# Patient Record
Sex: Female | Born: 1965 | Race: White | Hispanic: No | Marital: Married | Smoking: Former smoker
Health system: Southern US, Community
[De-identification: ages and names within clinical notes are randomized; demographics above are authoritative.]

---

## 2007-11-23 ENCOUNTER — Encounter (INDEPENDENT_AMBULATORY_CARE_PROVIDER_SITE_OTHER): Payer: Self-pay | Admitting: Obstetrics and Gynecology

## 2007-11-23 ENCOUNTER — Ambulatory Visit (HOSPITAL_COMMUNITY): Admission: RE | Admit: 2007-11-23 | Discharge: 2007-11-24 | Payer: Self-pay | Admitting: Obstetrics and Gynecology

## 2008-06-04 ENCOUNTER — Encounter: Admission: RE | Admit: 2008-06-04 | Discharge: 2008-06-04 | Payer: Self-pay | Admitting: Obstetrics and Gynecology

## 2010-09-23 NOTE — Op Note (Signed)
NAMEDELMY, HOLDREN                ACCOUNT NO.:  [id number]   MEDICAL RECORD NO.:  [id number]          PATIENT TYPE:  OIB   LOCATION:  9312                          FACILITY:  [facility]   PHYSICIAN:  Duke Salvia. Marcelle Overlie, M.D.DATE OF BIRTH:  11/08/1965   DATE OF PROCEDURE:  11/23/2007  DATE OF DISCHARGE:                               OPERATIVE REPORT   PREOPERATIVE DIAGNOSIS:  Recurrent cervical dysplasia.   POSTOPERATIVE DIAGNOSIS:  Recurrent cervical dysplasia.   PROCEDURE:  Laparoscopic-assisted vaginal hysterectomy.   SURGEON:  Duke Salvia. Marcelle Overlie, MD   ASSISTANT:  Freddy Finner, MD   ANESTHESIA:  General endotracheal.   COMPLICATIONS:  None.   DRAINS:  Foley catheter.   BLOOD LOSS:  250.   PROCEDURE AND FINDINGS:  The patient was taken to the operating room.  After an adequate level of general anesthesia was obtained with the  patient legs in stirrups, the abdomen, perineum and vagina were prepped  and draped in the usual manner for LAVH.  Her bladder was drained, EUA  carried out, uterus was normal size, mobile, adnexa negative.  Hulka  tenaculum was positioned.  Attention directed to the subumbilical area  which was infiltrated with 0.25% Marcaine plain.  A small incision was  made and the Veress needle was introduced without difficulty.  Its intra-  abdominal position was verified by pressure and water testing.  After 3  liters, pneumoperitoneum was then created.  Laparoscopic trocar and  sleeve were then introduced without difficulty.  There was no evidence  of any bleeding or trauma.  Three fingerbreadths above the symphysis in  midline, a 5-mm trocar was inserted under direct visualization.   The patient placed in Trendelenburg and pelvic findings were  unremarkable.  Using the EndoSeal  instrument starting at the right, the  utero-ovarian pedicle was coagulated and divided down to and including  the round ligament with excellent hemostasis.  Exact same repeated on  the opposite side.  Both ovaries were thus conserved.  The vaginal  portion of the procedure started next.   Legs were extended.  Weighted speculum was positioned.  The cervical  vaginal mucosa was incised with a Bovie.  Posterior colpotomy performed  without difficulty.  The bladder was advanced superiorly with sharp and  blunt dissection until the peritoneal reflection could be identified,  peritoneum entered sharply, retractor positioned used to elevate the  bladder gently out of the field.   Using the handheld Gyrus instrument, the uterosacral ligament, cardinal  ligament and uterine vasculature pedicles were then coagulated and  divided.  The fundus of the uterus was then delivered posteriorly.  Remaining pedicles were grasped with a clamp, the specimen was excised  and the free pedicles were tied with free tie of 0 Vicryl.  The cuff was  then closed from 3 to 9 o'clock with a running locked 2-0 Vicryl suture.  This was hemostatic prior to closure.  Sponge, needle and the instrument  counts were reported as correct x2.  McCall culdoplasty suture was then  positioned picking up the left uterosacral ligament, posterior  peritoneum across to  the right uterosacral ligament which was tied down  for extra posterior support.  The cuff was then closed right-to-left  with interrupted 2-0 Monocryl sutures.  Foley catheter positioned  draining clear urine.  Repeat laparoscopy carried out at that point.  Nezhat irrigator was used to irrigate thoroughly and several small areas  of oozing were coagulated with the EndoSeal.  Repeat irrigation and  inspection at reduced pressure revealed excellent hemostasis.  Instruments were removed, gas allowed to escape.  Defects closed with 4-  0 Dexon subcuticular sutures and Dermabond.  She tolerated this well,  went to recovery room in good condition.      Richard M. Marcelle Overlie, M.D.  Electronically Signed     RMH/MEDQ  D:  11/23/2007  T:  11/23/2007   Job:  [id number]

## 2010-09-23 NOTE — Discharge Summary (Signed)
NAMEJANUS, Monique Richardson                ACCOUNT NO.:  [id number]   MEDICAL RECORD NO.:  [id number]          PATIENT TYPE:  OIB   LOCATION:  9312                          [facility]ILITY[facility]  [facility]   PHYSICIAN:  Duke Salvia. Marcelle Overlie, M.D.DATE OF BIRTH:  06-29-1965   DATE OF ADMISSION:  11/23/2007  DATE OF DISCHARGE:  11/24/2007                               DISCHARGE SUMMARY   DISCHARGE DIAGNOSES:  1. Recurrent cervical dysplasia.  2. Laparoscopic-assisted vaginal hysterectomy this admission.   SUMMARY OF HISTORY AND PHYSICAL EXAM:  Please see admission H&P for  details.  Briefly, a 45 year old female who had a prior CKC with  recurrent dysplasia, who presents for hysterectomy.   HOSPITAL COURSE:  On November 23, 2007, under general anesthesia, the  patient underwent LAVH.  Both ovaries were normal and were conserved.  On the first postoperative day, she was tolerating a regular diet and  voiding without difficulty.  Her abdominal exam was unremarkable.  She  was afebrile.  Preoperative hemoglobin was 13.9, postoperative was 11.1.   OTHER LABORATORY DATA:  Blood type was O+.  UPT was negative.   DISPOSITION:  The patient was discharged on Tylox p.r.n. pain or Motrin  800 mg p.o. nightly q.12 hours p.r.n. pain.  Will return to the office  in 1 week.  Advised to report any increased vaginal bleeding, incisional  redness or drainage, persistent nausea, vomiting, or urinary complaints.  She was given some specific instructions regarding diet and exercise.   CONDITION:  Good.   ACTIVITY:  Graded increased.      Richard M. Marcelle Overlie, M.D.  Electronically Signed     RMH/MEDQ  D:  11/24/2007  T:  11/25/2007  Job:  [id number]

## 2010-09-23 NOTE — H&P (Signed)
Monique Richardson, Monique Richardson                ACCOUNT NO.:  [id number]   MEDICAL RECORD NO.:  [id number]          PATIENT TYPE:  AMB   LOCATION:  SDC                           [facility]   PHYSICIAN:  Duke Salvia. Marcelle Overlie, M.D.DATE OF BIRTH:  12-31-1965   DATE OF ADMISSION:  DATE OF DISCHARGE:                              HISTORY & PHYSICAL   CHIEF COMPLAINT:  Recurrent cervical dysplasia.   HISTORY OF PRESENT ILLNESS:  A 45 year old G3, P3, whose husband has had  a vasectomy.  I saw her originally in September 2008.  Her history is  significant for having had a prior cone biopsy in [place] in 2007.  Her followup Pap smear at that time has been either normal or atypical  cells since that time.  Pap smear done in October 2008 showed mild  dysplasia.  Colposcopy was satisfactory.  There was one area of  acetowhite that was completely biopsied off that returned mild  dysplasia.  Followup Pap in April 2009 returned high-grade dysplasia.  Repeat colposcopy and biopsy was performed in May 2009, that showed no  dysplasia.  The ECC negative, benign proliferative endometrium.  She  subsequently underwent two past LEEP after decision was made to schedule  hysterectomy.  A pathology from the LEEP specimen showed benign squamous  mucosa.  Because of persistence of the higher-grade dysplasia, she has  elected for the option of LAVH.   The procedure including risks related to bleeding, infection, adjacent  organ injury, the possible need for open or additional surgery were all  reviewed with her, which she understands and accepts.   PAST MEDICAL HISTORY:   ALLERGIES:  None.   OPERATIONS:  Cone biopsy.   OBSTETRICAL HISTORY:  One TAB and three vaginal deliveries.   CURRENT MEDICATIONS:  None.   FAMILY HISTORY:  Otherwise unremarkable.   PHYSICAL EXAMINATION:  VITAL SIGNS:  Temperature 98.2 and blood pressure  120/78.  HEENT:  Unremarkable.  NECK:  Supple without masses.  LUNGS:  Clear.  CARDIOVASCULAR:  Regular rate and rhythm without murmurs, rubs, or  gallops.  BREASTS:  Without masses.  ABDOMEN:  Soft and nontender.  PELVIC:  Normal external genitalia.  Vaginal and cervix clear.  Uterus  in mid position, normal size.  Adnexa negative.  NEURO:  Unremarkable.   IMPRESSION:  Recurrent cervical dysplasia history as noted above.   PLAN:  LAVH, procedure and risks reviewed as above.      Richard M. Marcelle Overlie, M.D.  Electronically Signed     RMH/MEDQ  D:  11/18/2007  T:  11/18/2007  Job:  [id number]

## 2011-02-05 LAB — CBC
HCT: 40.5
Hemoglobin: 13.9
MCHC: 34.4
MCV: 86.2
Platelets: 169
RBC: 4.71
RDW: 11.8
WBC: 5.1

## 2011-02-05 LAB — TYPE AND SCREEN
ABO/RH(D): O POS
Antibody Screen: NEGATIVE

## 2011-02-05 LAB — ABO/RH: ABO/RH(D): O POS

## 2011-02-06 LAB — CBC
HCT: 32.2 — ABNORMAL LOW
MCV: 87.1
RBC: 3.69 — ABNORMAL LOW
WBC: 6

## 2013-09-27 ENCOUNTER — Other Ambulatory Visit: Payer: Self-pay | Admitting: Obstetrics and Gynecology

## 2013-09-27 DIAGNOSIS — R928 Other abnormal and inconclusive findings on diagnostic imaging of breast: Secondary | ICD-10-CM

## 2013-10-09 ENCOUNTER — Ambulatory Visit
Admission: RE | Admit: 2013-10-09 | Discharge: 2013-10-09 | Disposition: A | Discharge status: Home / Self Care | Payer: BC Managed Care – PPO | Source: Ambulatory Visit | Attending: Obstetrics and Gynecology | Admitting: Obstetrics and Gynecology

## 2013-10-09 ENCOUNTER — Encounter (INDEPENDENT_AMBULATORY_CARE_PROVIDER_SITE_OTHER): Payer: Self-pay

## 2013-10-09 DIAGNOSIS — R928 Other abnormal and inconclusive findings on diagnostic imaging of breast: Secondary | ICD-10-CM

## 2013-10-13 ENCOUNTER — Ambulatory Visit
Admission: RE | Admit: 2013-10-13 | Discharge: 2013-10-13 | Disposition: A | Discharge status: Home / Self Care | Payer: BC Managed Care – PPO | Source: Ambulatory Visit | Attending: Chiropractic Medicine | Admitting: Chiropractic Medicine

## 2013-10-13 ENCOUNTER — Other Ambulatory Visit: Payer: Self-pay | Admitting: Chiropractic Medicine

## 2013-10-13 DIAGNOSIS — R29898 Other symptoms and signs involving the musculoskeletal system: Secondary | ICD-10-CM

## 2013-10-13 DIAGNOSIS — M542 Cervicalgia: Secondary | ICD-10-CM

## 2013-10-13 DIAGNOSIS — F489 Nonpsychotic mental disorder, unspecified: Secondary | ICD-10-CM

## 2014-03-20 ENCOUNTER — Other Ambulatory Visit: Payer: Self-pay | Admitting: Obstetrics and Gynecology

## 2014-03-21 LAB — CYTOLOGY - PAP

## 2014-04-10 ENCOUNTER — Other Ambulatory Visit: Payer: Self-pay | Admitting: Obstetrics and Gynecology

## 2014-06-15 ENCOUNTER — Other Ambulatory Visit (HOSPITAL_BASED_OUTPATIENT_CLINIC_OR_DEPARTMENT_OTHER): Payer: BLUE CROSS/BLUE SHIELD | Admitting: Lab

## 2014-06-15 ENCOUNTER — Ambulatory Visit: Payer: BLUE CROSS/BLUE SHIELD

## 2014-06-15 ENCOUNTER — Ambulatory Visit (HOSPITAL_BASED_OUTPATIENT_CLINIC_OR_DEPARTMENT_OTHER): Payer: BLUE CROSS/BLUE SHIELD | Admitting: Hematology & Oncology

## 2014-06-15 VITALS — BP 119/78 | HR 89 | Temp 98.0°F | Resp 18 | Wt 173.0 lb

## 2014-06-15 DIAGNOSIS — D709 Neutropenia, unspecified: Secondary | ICD-10-CM

## 2014-06-15 DIAGNOSIS — D72819 Decreased white blood cell count, unspecified: Secondary | ICD-10-CM

## 2014-06-15 LAB — CBC WITH DIFFERENTIAL (CANCER CENTER ONLY)
BASO#: 0 10*3/uL (ref 0.0–0.2)
BASO%: 0.3 % (ref 0.0–2.0)
EOS ABS: 0 10*3/uL (ref 0.0–0.5)
EOS%: 1.3 % (ref 0.0–7.0)
HEMATOCRIT: 37.3 % (ref 34.8–46.6)
HGB: 12.6 g/dL (ref 11.6–15.9)
LYMPH#: 2.2 10*3/uL (ref 0.9–3.3)
LYMPH%: 71.1 % — AB (ref 14.0–48.0)
MCH: 28.2 pg (ref 26.0–34.0)
MCHC: 33.8 g/dL (ref 32.0–36.0)
MCV: 83 fL (ref 81–101)
MONO#: 0.2 10*3/uL (ref 0.1–0.9)
MONO%: 6.3 % (ref 0.0–13.0)
NEUT%: 21 % — ABNORMAL LOW (ref 39.6–80.0)
NEUTROS ABS: 0.7 10*3/uL — AB (ref 1.5–6.5)
Platelets: 145 10*3/uL (ref 145–400)
RBC: 4.47 10*6/uL (ref 3.70–5.32)
RDW: 12.9 % (ref 11.1–15.7)
WBC: 3.2 10*3/uL — AB (ref 3.9–10.0)

## 2014-06-15 LAB — CHCC SATELLITE - SMEAR

## 2014-06-15 NOTE — Progress Notes (Signed)
Referral MD  Reason for Referral: Neutropenia   Chief Complaint  Patient presents with  . Follow-up  : I'm here because I was told that I was at risk for infection  HPI: Monique Richardson is a very charming 50 year old white female. She really has no past history. She has had a harsh old hysterectomy several years ago. This is for a cervical issue.  She's not been to a doctor for about 3 or 4 years.  Because of work, she had a get a physical done. She went to Dr. Tollie Pizza. He probably had some lab work on her. Suppository  , She was found to have a white cell count of 2.6. Hemoglobin 12.6. Platelet count was 164K. Her white cell differential showed 9% segs, 75% lymphs, 10 monos. She had some atypical lymphocytes. She had a normal electrolyte panel. Her TSH was okay.  His office called our office and wanted her to be seen immediately.  She has felt well. She says she burned her tongue about 3 days ago.  She has been working without difficulty. She works for [employer]. 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  She's had no weight loss or weight gain. She's had no fever. She's had no infections. She's had no change in bowel or bladder habits. She has been getting her mammograms.  She's had no rashes. She is not a vegetarian. Her appetite has been doing pretty well.  She does not use any kind of supplements.  She does not smoke. She has social alcoholl use.  There is no obvious risk factors for HIV or hepatitis.  She has very fair skin. She has had some moles removed.  She was kindly referred to our office.   No past medical history on file.:  No past surgical history on file.:  No current outpatient prescriptions on file.:  :  No Known Allergies:  No family history on file.:  History   Social History  . Marital Status: Married    Spouse Name: N/A    Number of Children: N/A  . Years of Education: N/A   Occupational History  . Not on  file.   Social History Main Topics  . Smoking status: Not on file  . Smokeless tobacco: Not on file  . Alcohol Use: Not on file  . Drug Use: Not on file  . Sexual Activity: Not on file   Other Topics Concern  . Not on file   Social History Narrative  . No narrative on file  :  Pertinent items are noted in HPI.  Exam: @IPVITALS @  well-developed and well-nourished white female in no obvious distress. Vital signs show temperature of 98. Pulse 89. Blood pressure 119/78. Weight is 173 pounds. Head and neck exam shows no ocular or oral lesions. There are no palpable cervical or supraclavicular lymph nodes. Lungs are clear bilaterally. Cardiac exam regular rate and rhythm with no murmurs, rubs or bruits. Abdomen is soft. She has good bowel sounds. There is no fluid wave. There is no palpable liver or spleen tip. Back exam shows no tenderness over the spine, ribs or hips. Extremities shows no clubbing, cyanosis or edema. She has good range of motion of her joints. There is no joint swelling, erythema or warmth. Skin exam shows no rashes, ecchymoses or petechia. Neurological exam is nonfocal.    Recent Labs  06/15/14 1404  WBC 3.2*  HGB 12.6  HCT 37.3  PLT 145  No results for input(s): NA, K, CL, CO2, GLUCOSE, BUN, CREATININE, CALCIUM in the last 72 hours.  Blood smear review: Normochromic and normocytic population of red blood cells. There are no nucleated red blood cells. There are no teardrop cells. She has no schistocytes or spherocytes. White cells appear normal in morphology and maturation. She has decreased neutrophils. She has no hypersegmented polys. She has an increase in lymphocytes. She has several large atypical lymphocytes. There are a few lymphocytes with large granules. I see no blasts. Platelets are adequate in number and size.  Pathology: None     Assessment and Plan: Monique Richardson is a very nice 49 year old white female. She has leukopenia and neutropenia. She had much  worse neutropenia yesterday. This seems to be improving.  I'm not sure as to why she would have these significant neutropenia. I'll see anything on her exam that would suggest any conic collagen vascular disease. I'll see anything that would suggest a myelodysplastic process.  It is really hard to know how quickly this will improve her how much she will improve. It is possible that she may have some degree of leukopenia and neutropenia.  For now, I do not see that we have to do a bone marrow test on her.  She is asymptomatic. I think because of this, we can just watch her and plan to get her back in about 6 weeks. I would like to think that in 6 weeks, her blood count will have normalized.  I told her that she pretty much do what she wants right now. She does not have to confine herself to home. She'll go out. She can exercise. She can work. She can eat what she would like.  I spent about an hour with she and her husband. They're both very very nice. She is originally from [facility].

## 2014-06-20 ENCOUNTER — Telehealth: Payer: Self-pay | Admitting: Hematology & Oncology

## 2014-06-20 NOTE — Telephone Encounter (Signed)
Faxed referral to Dr. Antonieta Pert office.

## 2014-07-25 ENCOUNTER — Ambulatory Visit (HOSPITAL_BASED_OUTPATIENT_CLINIC_OR_DEPARTMENT_OTHER): Payer: BLUE CROSS/BLUE SHIELD | Admitting: Hematology & Oncology

## 2014-07-25 ENCOUNTER — Encounter: Payer: Self-pay | Admitting: Hematology & Oncology

## 2014-07-25 ENCOUNTER — Other Ambulatory Visit (HOSPITAL_BASED_OUTPATIENT_CLINIC_OR_DEPARTMENT_OTHER): Payer: BLUE CROSS/BLUE SHIELD | Admitting: Lab

## 2014-07-25 VITALS — BP 120/82 | HR 77 | Temp 99.0°F | Resp 14 | Wt 174.0 lb

## 2014-07-25 DIAGNOSIS — G8929 Other chronic pain: Secondary | ICD-10-CM | POA: Insufficient documentation

## 2014-07-25 DIAGNOSIS — D729 Disorder of white blood cells, unspecified: Secondary | ICD-10-CM | POA: Insufficient documentation

## 2014-07-25 DIAGNOSIS — M545 Low back pain: Secondary | ICD-10-CM

## 2014-07-25 DIAGNOSIS — D72819 Decreased white blood cell count, unspecified: Secondary | ICD-10-CM

## 2014-07-25 DIAGNOSIS — R799 Abnormal finding of blood chemistry, unspecified: Secondary | ICD-10-CM | POA: Insufficient documentation

## 2014-07-25 LAB — CBC WITH DIFFERENTIAL (CANCER CENTER ONLY)
BASO#: 0 10*3/uL (ref 0.0–0.2)
BASO%: 0.3 % (ref 0.0–2.0)
EOS%: 1.4 % (ref 0.0–7.0)
Eosinophils Absolute: 0.1 10*3/uL (ref 0.0–0.5)
HEMATOCRIT: 35.5 % (ref 34.8–46.6)
HEMOGLOBIN: 12.2 g/dL (ref 11.6–15.9)
LYMPH#: 2.6 10*3/uL (ref 0.9–3.3)
LYMPH%: 71.4 % — AB (ref 14.0–48.0)
MCH: 28.8 pg (ref 26.0–34.0)
MCHC: 34.4 g/dL (ref 32.0–36.0)
MCV: 84 fL (ref 81–101)
MONO#: 0.3 10*3/uL (ref 0.1–0.9)
MONO%: 6.9 % (ref 0.0–13.0)
NEUT#: 0.7 10*3/uL — ABNORMAL LOW (ref 1.5–6.5)
NEUT%: 20 % — AB (ref 39.6–80.0)
Platelets: 107 10*3/uL — ABNORMAL LOW (ref 145–400)
RBC: 4.24 10*6/uL (ref 3.70–5.32)
RDW: 12.9 % (ref 11.1–15.7)
WBC: 3.6 10*3/uL — AB (ref 3.9–10.0)

## 2014-07-25 LAB — CHCC SATELLITE - SMEAR

## 2014-07-25 NOTE — Progress Notes (Signed)
  Hematology and Oncology Follow Up Visit  Monique Richardson [id number] 1965/11/08 48 y.o. 07/25/2014   Principle Diagnosis:  Chronic leukopenia   Current Therapy:    Observation     Interim History:  Monique Richardson is back for her second office visit. When we first saw her, she had an unremarkable blood smear and exam.  She's been doing well. She has no specific complaints. She and her husband are going to [place] in April.  She's had no cough. She's had no rashes. She's had no weight loss. She is worried about weight gain. There's been no change in bowel or bladder habits.  She is due for a mammogram soon.  She's had no headache. She's had no swallowing problems.  Medications:  Current outpatient prescriptions:  .  celecoxib (CELEBREX) 200 MG capsule, , Disp: , Rfl: 1  Allergies:  Allergies  Allergen Reactions  . No Known Allergies     Past Medical History, Surgical history, Social history, and Family History were reviewed and updated.  Review of Systems: As above  Physical Exam:  weight is 174 lb (78.926 kg). Her oral temperature is 99 F (37.2 C). Her blood pressure is 120/82 and her pulse is 77. Her respiration is 14.   Wt Readings from Last 3 Encounters:  07/25/14 174 lb (78.926 kg)  06/15/14 173 lb (78.472 kg)     Well-developed and well-nourished white female in no obvious distress. Head and neck exam shows no adenopathy in the neck. Thyroid is not palpable. Lungs are clear bilaterally. Cardiac exam regular rate and rhythm with a normal S1 and S2 with no murmurs, rubs or bruits. Abdomen is soft. She has good bowel sounds. There is no fluid wave. There is no palpable liver or spleen tip. Back exam shows no tenderness over the spine, ribs or hips. Extremities shows no clubbing, cyanosis or edema. Skin exam shows no rashes, ecchymoses or petechia. Neurological exam is nonfocal.  He has    Chemistry   No results found for: NA, K, CL, CO2, BUN, CREATININE, GLU No  results found for: CALCIUM, ALKPHOS, AST, ALT, BILITOT       Impression and Plan: Monique Richardson is 49 year old with leukopenia. Her white cell count is slightly better. I looked at her blood on the microscope. She had good maturation of her white blood cells. She had an increase in lymphocytes which appeared mature. I saw no blasts.  Her platelets are lower. On the blood smear, the platelets may have been clumping a little bit. However, they appear to be mature with good granulation.  For now, I still do not see that we had to do a bone marrow test on her.  I want to see her back in 4 months. I think this will be a good time frame in order to make decisions regarding anything invasive.  I spent about 25 minutes with her. I went over her lab work. I gave her some recommendations when she goes to [place]. I told her to drink a lot of water on the plane but did not drink anything but bottled water [place]. I told her to take baby aspirin a week before her trip. I told her to bring some Pepto-Bismol with her.     Volanda Napoleon, MD 3/16/20165:10 PM

## 2014-11-01 ENCOUNTER — Other Ambulatory Visit: Payer: Self-pay | Admitting: Obstetrics and Gynecology

## 2014-11-05 LAB — CYTOLOGY - PAP

## 2014-11-21 ENCOUNTER — Ambulatory Visit (HOSPITAL_BASED_OUTPATIENT_CLINIC_OR_DEPARTMENT_OTHER): Payer: BLUE CROSS/BLUE SHIELD | Admitting: Hematology & Oncology

## 2014-11-21 ENCOUNTER — Encounter: Payer: Self-pay | Admitting: Hematology & Oncology

## 2014-11-21 ENCOUNTER — Telehealth: Payer: Self-pay | Admitting: *Deleted

## 2014-11-21 ENCOUNTER — Other Ambulatory Visit (HOSPITAL_BASED_OUTPATIENT_CLINIC_OR_DEPARTMENT_OTHER): Payer: BLUE CROSS/BLUE SHIELD

## 2014-11-21 VITALS — BP 107/64 | HR 72 | Temp 98.0°F | Resp 14 | Ht 68.0 in | Wt 174.0 lb

## 2014-11-21 DIAGNOSIS — D72819 Decreased white blood cell count, unspecified: Secondary | ICD-10-CM

## 2014-11-21 DIAGNOSIS — D729 Disorder of white blood cells, unspecified: Secondary | ICD-10-CM

## 2014-11-21 LAB — CBC WITH DIFFERENTIAL (CANCER CENTER ONLY)
BASO#: 0 10*3/uL (ref 0.0–0.2)
BASO%: 0.6 % (ref 0.0–2.0)
EOS%: 2.2 % (ref 0.0–7.0)
Eosinophils Absolute: 0.1 10*3/uL (ref 0.0–0.5)
HEMATOCRIT: 36.5 % (ref 34.8–46.6)
HEMOGLOBIN: 12.4 g/dL (ref 11.6–15.9)
LYMPH#: 2.3 10*3/uL (ref 0.9–3.3)
LYMPH%: 73.2 % — ABNORMAL HIGH (ref 14.0–48.0)
MCH: 28.8 pg (ref 26.0–34.0)
MCHC: 34 g/dL (ref 32.0–36.0)
MCV: 85 fL (ref 81–101)
MONO#: 0.3 10*3/uL (ref 0.1–0.9)
MONO%: 9.3 % (ref 0.0–13.0)
NEUT%: 14.7 % — ABNORMAL LOW (ref 39.6–80.0)
NEUTROS ABS: 0.5 10*3/uL — AB (ref 1.5–6.5)
Platelets: 122 10*3/uL — ABNORMAL LOW (ref 145–400)
RBC: 4.31 10*6/uL (ref 3.70–5.32)
RDW: 13.1 % (ref 11.1–15.7)
WBC: 3.1 10*3/uL — AB (ref 3.9–10.0)

## 2014-11-21 LAB — CHCC SATELLITE - SMEAR

## 2014-11-21 LAB — TECHNOLOGIST REVIEW CHCC SATELLITE

## 2014-11-21 NOTE — Telephone Encounter (Signed)
Critical Value Neutrophils 0.5 Dr Ennever aware. No orders at this time 

## 2014-11-21 NOTE — Progress Notes (Signed)
  Hematology and Oncology Follow Up Visit  Monique Richardson [id number] [birth year] 49 y.o. 11/21/2014   Principle Diagnosis:  Chronic leukopenia   Current Therapy:    Observation     Interim History:  Monique Richardson is back for follow-up. She is doing well. She had a good time in [place]. She showed me a lot of pictures. She has had no problems with infections over the. She denied drink the water.  This summer, she's had no problems. She's had no fever. She had no rashes. There's been no bleeding. She's had no change in bowel or bladder habits. She's had no cough. She has had no sores in Monique mouth.  She will be a grandmother soon. Monique Richardson and his girlfriend will be having a baby.  Medications:  Current outpatient prescriptions:  Marland Kitchen  UNABLE TO FIND, 11-21-14 NOT TAKING ANY MEDICATIONS, Disp: , Rfl:   Allergies:  Allergies  Allergen Reactions  . No Known Allergies     Past Medical History, Surgical history, Social history, and Family History were reviewed and updated.  Review of Systems: As above  Physical Exam:  height is 5\' 8"  (1.727 m) and weight is 174 lb (78.926 kg). Monique oral temperature is 98 F (36.7 C). Monique blood pressure is 107/64 and Monique pulse is 72. Monique respiration is 14.   Wt Readings from Last 3 Encounters:  11/21/14 174 lb (78.926 kg)  07/25/14 174 lb (78.926 kg)  06/15/14 173 lb (78.472 kg)     Well-developed and well-nourished white female in no obvious distress. Head and neck exam shows no adenopathy in the neck. Thyroid is not palpable. Lungs are clear bilaterally. Cardiac exam regular rate and rhythm with a normal S1 and S2 with no murmurs, rubs or bruits. Abdomen is soft. She has good bowel sounds. There is no fluid wave. There is no palpable liver or spleen tip. Back exam shows no tenderness over the spine, ribs or hips. Extremities shows no clubbing, cyanosis or edema. Skin exam shows no rashes, ecchymoses or petechia. Neurological exam is nonfocal.  He  has    Chemistry   No results found for: NA, K, CL, CO2, BUN, CREATININE, GLU No results found for: CALCIUM, ALKPHOS, AST, ALT, BILITOT       Impression and Plan: Monique Richardson is 49 year old with leukopenia. Monique white cell count is slightly lower. I looked at Monique blood on the microscope. She had good maturation of Monique white blood cells. She had an increase in lymphocytes which appeared mature. I saw no blasts.  Monique platelets are better. On the blood smear, the platelets may have been clumping a little bit. However, they appear to have mature with good granulation.  For now, I still do not see that we had to do a bone marrow test on Monique.  I want to see Monique back in 4 months. I think this will be a good time frame in order to make decisions regarding anything invasive.  I spent about 25 minutes with Monique. I went over Monique lab work. I made sure that she takes a lot of water this summer and that she uses a lot of sunscreen.    Volanda Napoleon, MD 7/13/201610:58 AM

## 2015-03-25 ENCOUNTER — Encounter: Payer: Self-pay | Admitting: Hematology & Oncology

## 2015-03-25 ENCOUNTER — Other Ambulatory Visit (HOSPITAL_BASED_OUTPATIENT_CLINIC_OR_DEPARTMENT_OTHER): Payer: BLUE CROSS/BLUE SHIELD

## 2015-03-25 ENCOUNTER — Ambulatory Visit (HOSPITAL_BASED_OUTPATIENT_CLINIC_OR_DEPARTMENT_OTHER): Payer: BLUE CROSS/BLUE SHIELD | Admitting: Hematology & Oncology

## 2015-03-25 ENCOUNTER — Telehealth: Payer: Self-pay | Admitting: *Deleted

## 2015-03-25 VITALS — BP 107/78 | HR 87 | Temp 98.0°F | Resp 16 | Ht 68.0 in | Wt 178.0 lb

## 2015-03-25 DIAGNOSIS — D72819 Decreased white blood cell count, unspecified: Secondary | ICD-10-CM

## 2015-03-25 DIAGNOSIS — D729 Disorder of white blood cells, unspecified: Secondary | ICD-10-CM

## 2015-03-25 LAB — CBC WITH DIFFERENTIAL (CANCER CENTER ONLY)
BASO#: 0 10*3/uL (ref 0.0–0.2)
BASO%: 0.5 % (ref 0.0–2.0)
EOS%: 2.1 % (ref 0.0–7.0)
Eosinophils Absolute: 0.1 10*3/uL (ref 0.0–0.5)
HCT: 39 % (ref 34.8–46.6)
HGB: 12.9 g/dL (ref 11.6–15.9)
LYMPH#: 3.3 10*3/uL (ref 0.9–3.3)
LYMPH%: 76.9 % — AB (ref 14.0–48.0)
MCH: 28.2 pg (ref 26.0–34.0)
MCHC: 33.1 g/dL (ref 32.0–36.0)
MCV: 85 fL (ref 81–101)
MONO#: 0.3 10*3/uL (ref 0.1–0.9)
MONO%: 7.9 % (ref 0.0–13.0)
NEUT#: 0.5 10*3/uL — CL (ref 1.5–6.5)
NEUT%: 12.6 % — AB (ref 39.6–80.0)
PLATELETS: 135 10*3/uL — AB (ref 145–400)
RBC: 4.58 10*6/uL (ref 3.70–5.32)
RDW: 12.9 % (ref 11.1–15.7)
WBC: 4.3 10*3/uL (ref 3.9–10.0)

## 2015-03-25 LAB — CHCC SATELLITE - SMEAR

## 2015-03-25 NOTE — Progress Notes (Signed)
  Hematology and Oncology Follow Up Visit  Monique Richardson [id number] 1965/12/26 48 y.o. 03/25/2015   Principle Diagnosis:  Chronic leukopenia   Current Therapy:    Observation     Interim History:  Monique Richardson is back for follow-up. She is now grandmother. Her son and his girlfriend had there a BE about 4 weeks ago. He was a healthy baby boy.  Otherwise, the news is that they're moving to [place]. They will be 2 hours away. Her husband will be transferred. However, she still wants to come and see her doctors in the area.  She's had no problems rashes. She's had no fever. She's had no infections. She's had no change in bowel or bladder habits.  She has her mammograms yearly. I did last mammogram she had was this summer.  She's had no change in her appetite. His been no change in her diet. Patient had no cough. She's had no swollen lymph nodes.  Overall, her performance status is ECOG 0. His Medications:  Current outpatient prescriptions:  Marland Kitchen  UNABLE TO FIND, 11-21-14 NOT TAKING ANY MEDICATIONS, Disp: , Rfl:   Allergies:  Allergies  Allergen Reactions  . No Known Allergies     Past Medical History, Surgical history, Social history, and Family History were reviewed and updated.  Review of Systems: As above  Physical Exam:  height is $RemoveB'5\' 8"'SHlCVclF$  (1.727 m) and weight is 178 lb (80.74 kg). Her oral temperature is 98 F (36.7 C). Her blood pressure is 107/78 and her pulse is 87. Her respiration is 16.   Wt Readings from Last 3 Encounters:  03/25/15 178 lb (80.74 kg)  11/21/14 174 lb (78.926 kg)  07/25/14 174 lb (78.926 kg)     Well-developed and well-nourished white female in no obvious distress. Head and neck exam shows no adenopathy in the neck. Thyroid is not palpable. Lungs are clear bilaterally. Cardiac exam regular rate and rhythm with a normal S1 and S2 with no murmurs, rubs or bruits. Abdomen is soft. She has good bowel sounds. There is no fluid wave. There is no palpable  liver or spleen tip. Back exam shows no tenderness over the spine, ribs or hips. Extremities shows no clubbing, cyanosis or edema. Skin exam shows no rashes, ecchymoses or petechia. Neurological exam is nonfocal.  He has    Chemistry   No results found for: NA, K, CL, CO2, BUN, CREATININE, GLU No results found for: CALCIUM, ALKPHOS, AST, ALT, BILITOT       Impression and Plan: Monique Richardson is 49 year old with leukopenia. Although her white cell count is a little bit higher, her absolute neutrophil count is still quite low.  I think that at some point, she will need to have a bone marrow biopsy done. Since she is asymptomatic, and since her blood smear has been relatively benign, I really am not sure that a bone marrow biopsy is going to help [place] out right now.  I will plan to see her back in another 4 months. I want to try to get her through the holidays and wintertime before she comes back.   Volanda Napoleon, MD 11/14/201610:28 AM

## 2015-03-25 NOTE — Telephone Encounter (Signed)
Critical Value Neutrophils 0.5 Dr Ennever aware. No orders at this time 

## 2015-07-22 ENCOUNTER — Encounter: Payer: Self-pay | Admitting: Hematology & Oncology

## 2015-07-22 ENCOUNTER — Other Ambulatory Visit (HOSPITAL_BASED_OUTPATIENT_CLINIC_OR_DEPARTMENT_OTHER): Payer: BLUE CROSS/BLUE SHIELD

## 2015-07-22 ENCOUNTER — Ambulatory Visit (HOSPITAL_BASED_OUTPATIENT_CLINIC_OR_DEPARTMENT_OTHER): Payer: BLUE CROSS/BLUE SHIELD | Admitting: Hematology & Oncology

## 2015-07-22 VITALS — BP 114/73 | HR 69 | Temp 98.5°F | Resp 16 | Ht 68.0 in | Wt 179.0 lb

## 2015-07-22 DIAGNOSIS — D729 Disorder of white blood cells, unspecified: Secondary | ICD-10-CM

## 2015-07-22 DIAGNOSIS — D72819 Decreased white blood cell count, unspecified: Secondary | ICD-10-CM

## 2015-07-22 DIAGNOSIS — R799 Abnormal finding of blood chemistry, unspecified: Secondary | ICD-10-CM

## 2015-07-22 LAB — CBC WITH DIFFERENTIAL (CANCER CENTER ONLY)
BASO#: 0 10*3/uL (ref 0.0–0.2)
BASO%: 0.4 % (ref 0.0–2.0)
EOS%: 1.5 % (ref 0.0–7.0)
Eosinophils Absolute: 0.1 10*3/uL (ref 0.0–0.5)
HCT: 36.8 % (ref 34.8–46.6)
HEMOGLOBIN: 12.6 g/dL (ref 11.6–15.9)
LYMPH#: 3.4 10*3/uL — ABNORMAL HIGH (ref 0.9–3.3)
LYMPH%: 73.6 % — ABNORMAL HIGH (ref 14.0–48.0)
MCH: 28.1 pg (ref 26.0–34.0)
MCHC: 34.2 g/dL (ref 32.0–36.0)
MCV: 82 fL (ref 81–101)
MONO#: 0.3 10*3/uL (ref 0.1–0.9)
MONO%: 7 % (ref 0.0–13.0)
NEUT%: 17.5 % — ABNORMAL LOW (ref 39.6–80.0)
NEUTROS ABS: 0.8 10*3/uL — AB (ref 1.5–6.5)
PLATELETS: 128 10*3/uL — AB (ref 145–400)
RBC: 4.49 10*6/uL (ref 3.70–5.32)
RDW: 13.1 % (ref 11.1–15.7)
WBC: 4.6 10*3/uL (ref 3.9–10.0)

## 2015-07-22 LAB — CHCC SATELLITE - SMEAR

## 2015-07-22 NOTE — Progress Notes (Signed)
  Hematology and Oncology Follow Up Visit  Monique Richardson [id number] Sep 13, 1965 50 y.o. 07/22/2015   Principle Diagnosis:  Chronic leukopenia   Current Therapy:    Observation     Interim History:  Monique Richardson is back for follow-up. She is doing fairly well. She has not yet moved up to [place]. Her husband has been transferred but did not yet moved up there. It sounds that they probably will move up in the late spring or early summer.  She's had no problems since we last saw her. She's had no issues with infections. She's had no fever. She's had no rashes. She's had no nausea or vomiting. She's had no change in bowel or bladder habits.  Her last mammogram was back in 2015. I am not sure  when her next one is scheduled. Overall, her performance status is ECOG 0. His Medications:  Current outpatient prescriptions:  Marland Kitchen  UNABLE TO FIND, 11-21-14 NOT TAKING ANY MEDICATIONS, Disp: , Rfl:   Allergies:  Allergies  Allergen Reactions  . No Known Allergies     Past Medical History, Surgical history, Social history, and Family History were reviewed and updated.  Review of Systems: As above  Physical Exam:  height is 5\' 8"  (1.727 m) and weight is 179 lb (81.194 kg). Her oral temperature is 98.5 F (36.9 C). Her blood pressure is 114/73 and her pulse is 69. Her respiration is 16.   Wt Readings from Last 3 Encounters:  07/22/15 179 lb (81.194 kg)  03/25/15 178 lb (80.74 kg)  11/21/14 174 lb (78.926 kg)     Well-developed and well-nourished white female in no obvious distress. Head and neck exam shows no adenopathy in the neck. Thyroid is not palpable. Lungs are clear bilaterally. Cardiac exam regular rate and rhythm with a normal S1 and S2 with no murmurs, rubs or bruits. Abdomen is soft. She has good bowel sounds. There is no fluid wave. There is no palpable liver or spleen tip. Back exam shows no tenderness over the spine, ribs or hips. Extremities shows no clubbing, cyanosis or edema.  Skin exam shows no rashes, ecchymoses or petechia. Neurological exam is nonfocal.  He has    Chemistry   No results found for: NA, K, CL, CO2, BUN, CREATININE, GLU No results found for: CALCIUM, ALKPHOS, AST, ALT, BILITOT       Impression and Plan: Monique Richardson is 50 year old with leukopenia. Her white cell count continues to improve. Her neutrophil count is slightly better. She still has a predominance of lymphocytes.  We see her back, I will have to run flow cytometry on her peripheral blood to see if she has some type of lymphoproliferative process. I certainly cannot find anything on clinical exam that would suggest this.  Because she is asymptomatic, I will see her back in 6 months.    Volanda Napoleon, MD 3/13/20172:13 PM

## 2016-01-21 ENCOUNTER — Other Ambulatory Visit (HOSPITAL_COMMUNITY)
Admission: RE | Admit: 2016-01-21 | Discharge: 2016-01-21 | Disposition: A | Discharge status: Home / Self Care | Payer: BLUE CROSS/BLUE SHIELD | Source: Ambulatory Visit | Attending: Hematology & Oncology | Admitting: Hematology & Oncology

## 2016-01-21 ENCOUNTER — Ambulatory Visit (HOSPITAL_BASED_OUTPATIENT_CLINIC_OR_DEPARTMENT_OTHER): Payer: BLUE CROSS/BLUE SHIELD | Admitting: Hematology & Oncology

## 2016-01-21 ENCOUNTER — Encounter: Payer: Self-pay | Admitting: Hematology & Oncology

## 2016-01-21 ENCOUNTER — Other Ambulatory Visit (HOSPITAL_BASED_OUTPATIENT_CLINIC_OR_DEPARTMENT_OTHER): Payer: BLUE CROSS/BLUE SHIELD

## 2016-01-21 VITALS — BP 134/76 | HR 68 | Temp 97.4°F | Resp 16 | Ht 68.0 in | Wt 176.0 lb

## 2016-01-21 DIAGNOSIS — R799 Abnormal finding of blood chemistry, unspecified: Secondary | ICD-10-CM

## 2016-01-21 DIAGNOSIS — D72819 Decreased white blood cell count, unspecified: Secondary | ICD-10-CM

## 2016-01-21 DIAGNOSIS — D729 Disorder of white blood cells, unspecified: Secondary | ICD-10-CM

## 2016-01-21 LAB — CBC WITH DIFFERENTIAL (CANCER CENTER ONLY)
BASO#: 0 10*3/uL (ref 0.0–0.2)
BASO%: 0.6 % (ref 0.0–2.0)
EOS%: 2.7 % (ref 0.0–7.0)
Eosinophils Absolute: 0.1 10*3/uL (ref 0.0–0.5)
HCT: 36.6 % (ref 34.8–46.6)
HGB: 12.4 g/dL (ref 11.6–15.9)
LYMPH#: 2.5 10*3/uL (ref 0.9–3.3)
LYMPH%: 74.9 % — AB (ref 14.0–48.0)
MCH: 28.4 pg (ref 26.0–34.0)
MCHC: 33.9 g/dL (ref 32.0–36.0)
MCV: 84 fL (ref 81–101)
MONO#: 0.3 10*3/uL (ref 0.1–0.9)
MONO%: 8.1 % (ref 0.0–13.0)
NEUT#: 0.5 10*3/uL — CL (ref 1.5–6.5)
NEUT%: 13.7 % — ABNORMAL LOW (ref 39.6–80.0)
PLATELETS: 131 10*3/uL — AB (ref 145–400)
RBC: 4.36 10*6/uL (ref 3.70–5.32)
RDW: 13.1 % (ref 11.1–15.7)
WBC: 3.4 10*3/uL — AB (ref 3.9–10.0)

## 2016-01-21 LAB — CHCC SATELLITE - SMEAR

## 2016-01-21 NOTE — Progress Notes (Signed)
  Hematology and Oncology Follow Up Visit  Monique Richardson [id number] May 17, 1965 49 y.o. 01/21/2016   Principle Diagnosis:  Chronic leukopenia   Current Therapy:    Observation     Interim History:  Monique Richardson is back for follow-up. She is doing fairly well. She has not yet moved up to [place]. Monique Richardson has been transferred and has been up there for the past year. They finally sold their house down here. They will close in October. There are having a hard time finding a home up in [place]..   She's had no problems since we last saw Monique. She's had no issues with infections. She's had no fever. She's had no rashes. She's had no nausea or vomiting. She's had no change in bowel or bladder habits.  Monique last mammogram was back in 2015. I am not sure  when Monique next one is scheduled.  She is trying to exercise. She is trying to watch what she eats. There is no change in Monique medications. She's had no cough. She's had no mouth sores. She's had no headache.   Overall, Monique performance status is ECOG 0.   His Medications:  Current Outpatient Prescriptions:  Marland Kitchen  UNABLE TO FIND, 11-21-14 NOT TAKING ANY MEDICATIONS, Disp: , Rfl:   Allergies:  Allergies  Allergen Reactions  . No Known Allergies     Past Medical History, Surgical history, Social history, and Family History were reviewed and updated.  Review of Systems: As above  Physical Exam:  height is 5\' 8"  (1.727 m) and weight is 176 lb (79.8 kg). Monique oral temperature is 97.4 F (36.3 C). Monique blood pressure is 134/76 and Monique pulse is 68. Monique respiration is 16.   Wt Readings from Last 3 Encounters:  01/21/16 176 lb (79.8 kg)  07/22/15 179 lb (81.2 kg)  03/25/15 178 lb (80.7 kg)     Well-developed and well-nourished white female in no obvious distress. Head and neck exam shows no adenopathy in the neck. Thyroid is not palpable. Lungs are clear bilaterally. Cardiac exam regular rate and rhythm with a normal S1 and S2 with no  murmurs, rubs or bruits. Abdomen is soft. She has good bowel sounds. There is no fluid wave. There is no palpable liver or spleen tip. Back exam shows no tenderness over the spine, ribs or hips. Extremities shows no clubbing, cyanosis or edema. Skin exam shows no rashes, ecchymoses or petechia. Neurological exam is nonfocal.  He has    Chemistry   No results found for: NA, K, CL, CO2, BUN, CREATININE, GLU No results found for: CALCIUM, ALKPHOS, AST, ALT, BILITOT       Impression and Plan: Monique Richardson is 50 year old with leukopenia. Monique white cell count continues to improve. Monique neutrophil count is slightly better. She still has a predominance of lymphocytes.  Ultimately, she will move up to [place] in October. She still wants to see [place].  We will plan to get Monique back in 6 months. I just do not see need for a bone marrow test on Monique. We have not yet done 1 but yet she has been totally asymptomatic so far.  She was noted that she calls she has any issues with infections.   Volanda Napoleon, MD 9/12/20171:08 PM

## 2016-01-27 LAB — FLOW CYTOMETRY - CHCC SATELLITE

## 2016-07-20 ENCOUNTER — Ambulatory Visit: Payer: BLUE CROSS/BLUE SHIELD | Admitting: Hematology & Oncology

## 2016-07-20 ENCOUNTER — Other Ambulatory Visit: Payer: BLUE CROSS/BLUE SHIELD

## 2017-01-15 ENCOUNTER — Ambulatory Visit: Payer: BLUE CROSS/BLUE SHIELD | Admitting: Hematology & Oncology

## 2017-01-15 ENCOUNTER — Other Ambulatory Visit (HOSPITAL_BASED_OUTPATIENT_CLINIC_OR_DEPARTMENT_OTHER): Payer: BLUE CROSS/BLUE SHIELD

## 2017-01-15 VITALS — BP 124/78 | HR 82 | Temp 98.6°F | Resp 17 | Wt 168.0 lb

## 2017-01-15 DIAGNOSIS — D510 Vitamin B12 deficiency anemia due to intrinsic factor deficiency: Secondary | ICD-10-CM

## 2017-01-15 DIAGNOSIS — M2550 Pain in unspecified joint: Secondary | ICD-10-CM

## 2017-01-15 DIAGNOSIS — M25569 Pain in unspecified knee: Secondary | ICD-10-CM

## 2017-01-15 DIAGNOSIS — M791 Myalgia: Secondary | ICD-10-CM

## 2017-01-15 DIAGNOSIS — R5383 Other fatigue: Secondary | ICD-10-CM

## 2017-01-15 DIAGNOSIS — F329 Major depressive disorder, single episode, unspecified: Secondary | ICD-10-CM

## 2017-01-15 DIAGNOSIS — D72819 Decreased white blood cell count, unspecified: Secondary | ICD-10-CM | POA: Diagnosis not present

## 2017-01-15 DIAGNOSIS — N921 Excessive and frequent menstruation with irregular cycle: Secondary | ICD-10-CM

## 2017-01-15 DIAGNOSIS — D729 Disorder of white blood cells, unspecified: Secondary | ICD-10-CM

## 2017-01-15 DIAGNOSIS — R799 Abnormal finding of blood chemistry, unspecified: Secondary | ICD-10-CM

## 2017-01-15 LAB — CBC WITH DIFFERENTIAL (CANCER CENTER ONLY)
BASO#: 0 10*3/uL (ref 0.0–0.2)
BASO%: 0.3 % (ref 0.0–2.0)
EOS ABS: 0.1 10*3/uL (ref 0.0–0.5)
EOS%: 1.6 % (ref 0.0–7.0)
HCT: 39.9 % (ref 34.8–46.6)
HGB: 13.5 g/dL (ref 11.6–15.9)
LYMPH#: 2.7 10*3/uL (ref 0.9–3.3)
LYMPH%: 73.6 % — AB (ref 14.0–48.0)
MCH: 28.9 pg (ref 26.0–34.0)
MCHC: 33.8 g/dL (ref 32.0–36.0)
MCV: 85 fL (ref 81–101)
MONO#: 0.2 10*3/uL (ref 0.1–0.9)
MONO%: 4.4 % (ref 0.0–13.0)
NEUT#: 0.7 10*3/uL — ABNORMAL LOW (ref 1.5–6.5)
NEUT%: 20.1 % — AB (ref 39.6–80.0)
PLATELETS: 146 10*3/uL (ref 145–400)
RBC: 4.67 10*6/uL (ref 3.70–5.32)
RDW: 13.3 % (ref 11.1–15.7)
WBC: 3.7 10*3/uL — AB (ref 3.9–10.0)

## 2017-01-15 LAB — COMPREHENSIVE METABOLIC PANEL (CC13)
ALT: 29 IU/L (ref 0–32)
AST: 33 IU/L (ref 0–40)
Albumin, Serum: 3.9 g/dL (ref 3.5–5.5)
Albumin/Globulin Ratio: 0.9 — ABNORMAL LOW (ref 1.2–2.2)
Alkaline Phosphatase, S: 56 IU/L (ref 39–117)
BUN/Creatinine Ratio: 20 (ref 9–23)
BUN: 16 mg/dL (ref 6–24)
Bilirubin Total: 0.9 mg/dL (ref 0.0–1.2)
CALCIUM: 9.8 mg/dL (ref 8.7–10.2)
CO2: 26 mmol/L (ref 20–29)
CREATININE: 0.81 mg/dL (ref 0.57–1.00)
Chloride, Ser: 104 mmol/L (ref 96–106)
GFR calc Af Amer: 99 mL/min/{1.73_m2} (ref >59)
GFR, EST NON AFRICAN AMERICAN: 86 mL/min/{1.73_m2} (ref >59)
GLUCOSE: 108 mg/dL — AB (ref 65–99)
Globulin, Total: 4.2 g/dL (ref 1.5–4.5)
Potassium, Ser: 3.9 mmol/L (ref 3.5–5.2)
Sodium: 139 mmol/L (ref 134–144)
Total Protein: 8.1 g/dL (ref 6.0–8.5)

## 2017-01-15 LAB — CHCC SATELLITE - SMEAR

## 2017-01-15 LAB — LACTATE DEHYDROGENASE: LDH: 265 U/L — ABNORMAL HIGH (ref 125–245)

## 2017-01-15 NOTE — Progress Notes (Signed)
Hematology and Oncology Follow Up Visit  Monique Richardson [id number] [birth year] 50 y.o. 01/15/2017   Principle Diagnosis:  Chronic leukopenia   Current Therapy:    Observation     Interim History:  Monique Richardson is back for follow-up. She now lives in [place]. Her husband was transferred up to [place]. She is not too excited to be there. Her children and grandchildren live here. In addition, there just is not a lot to do up there. She actually is working at a [employer].  The problem now is that she is complaining of a lot of arthralgias and myalgias. She feels tired. She does not have a lot of energy. She's had some bruising. She's had some rashes.  Of course, she does not have a family doctor up in [place]. I don't think she sees her doctor in [place] any longer.  She's had no foreign travel.  She's had no bleeding. She's had no obvious change in bowel or bladder habits.  There's been no cough. She's had no mouth sores. She's had no headache.  Overall, her performance status is ECOG 1.   Medications:  Current Outpatient Prescriptions:  Marland Kitchen  UNABLE TO FIND, 11-21-14 NOT TAKING ANY MEDICATIONS, Disp: , Rfl:   Allergies:  Allergies  Allergen Reactions  . No Known Allergies     Past Medical History, Surgical history, Social history, and Family History were reviewed and updated.  Review of Systems: As stated in the interim history  Physical Exam:  weight is 168 lb (76.2 kg). Her oral temperature is 98.6 F (37 C). Her blood pressure is 124/78 and her pulse is 82. Her respiration is 17 and oxygen saturation is 100%.   Wt Readings from Last 3 Encounters:  01/15/17 168 lb (76.2 kg)  01/21/16 176 lb (79.8 kg)  07/22/15 179 lb (81.2 kg)     Physical Exam  Constitutional: She is oriented to person, place, and time.  HENT:  Head: Normocephalic and atraumatic.  Mouth/Throat: Oropharynx is clear and moist.  Eyes: Pupils are equal, round, and reactive to light.  EOM are normal.  Neck: Normal range of motion.  Cardiovascular: Normal rate, regular rhythm and normal heart sounds.   Pulmonary/Chest: Effort normal and breath sounds normal.  Abdominal: Soft. Bowel sounds are normal.  Musculoskeletal: Normal range of motion. She exhibits no edema, tenderness or deformity.  Lymphadenopathy:    She has no cervical adenopathy.  Neurological: She is alert and oriented to person, place, and time.  Skin: Skin is warm and dry. No rash noted. No erythema.  Psychiatric: She has a normal mood and affect. Her behavior is normal. Judgment and thought content normal.  Vitals reviewed.     Chemistry   No results found for: NA, K, CL, CO2, BUN, CREATININE, GLU No results found for: CALCIUM, ALKPHOS, AST, ALT, BILITOT       Impression and Plan: Monique Richardson is a 51 year old white female. She has chronic leukopenia. [name] for several years. We have done close otology on her peripheral blood. 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 I suppose this might explain her symptoms. I know that patients with large granular lymphocytic leukemia can have rheumatologic issues.  I'm going to go ahead and do some additional lab work on her. I want to make sure that there is no primary rheumatologic condition. I also want to check her TSH. She is at risk for hypothyroidism.  At some point, we may actually have to consider a bone marrow biopsy on her.  I probably will get her back sooner than 6 months. However, she may need to see her primary care doctor if all of our tests are normal.  I really was not expected her to have these issues. I spent about 30 minutes with her. She is very thankful for [place] spending the time with her to try to make her feel better.    Volanda Napoleon, MD 9/7/20182:34 PM

## 2017-01-16 LAB — RHEUMATOID FACTOR: RA Latex Turbid.: <10 IU/mL (ref 0.0–13.9)

## 2017-01-16 LAB — VITAMIN B12: VITAMIN B 12: 394 pg/mL (ref 232–1245)

## 2017-01-18 ENCOUNTER — Telehealth: Payer: Self-pay | Admitting: *Deleted

## 2017-01-18 LAB — FERRITIN: FERRITIN: 71 ng/mL (ref 9–269)

## 2017-01-18 LAB — ANTINUCLEAR ANTIBODIES, IFA: ANA Titer 1: NEGATIVE

## 2017-01-18 LAB — TSH: TSH: 1.563 m[IU]/L (ref 0.308–3.960)

## 2017-01-18 LAB — IRON AND TIBC
%SAT: 30 % (ref 21–57)
Iron: 82 ug/dL (ref 41–142)
TIBC: 276 ug/dL (ref 236–444)
UIBC: 194 ug/dL (ref 120–384)

## 2017-01-18 NOTE — Telephone Encounter (Signed)
-----   Message from Volanda Napoleon, MD sent at 01/18/2017 11:47 AM EDT ----- Call - so far all of the tests are normal!!  Laurey Arrow

## 2018-11-23 ENCOUNTER — Other Ambulatory Visit: Payer: Self-pay | Admitting: *Deleted

## 2018-11-23 DIAGNOSIS — D729 Disorder of white blood cells, unspecified: Secondary | ICD-10-CM

## 2018-11-24 ENCOUNTER — Inpatient Hospital Stay: Payer: BC Managed Care – PPO | Attending: Hematology & Oncology | Admitting: Hematology & Oncology

## 2018-11-24 ENCOUNTER — Other Ambulatory Visit: Payer: Self-pay

## 2018-11-24 ENCOUNTER — Inpatient Hospital Stay: Payer: BC Managed Care – PPO

## 2018-11-24 ENCOUNTER — Encounter: Payer: Self-pay | Admitting: Hematology & Oncology

## 2018-11-24 VITALS — BP 116/69 | HR 55 | Temp 98.7°F | Resp 19 | Wt 159.0 lb

## 2018-11-24 DIAGNOSIS — Z79899 Other long term (current) drug therapy: Secondary | ICD-10-CM

## 2018-11-24 DIAGNOSIS — D729 Disorder of white blood cells, unspecified: Secondary | ICD-10-CM

## 2018-11-24 DIAGNOSIS — D72819 Decreased white blood cell count, unspecified: Secondary | ICD-10-CM | POA: Diagnosis not present

## 2018-11-24 DIAGNOSIS — B029 Zoster without complications: Secondary | ICD-10-CM | POA: Insufficient documentation

## 2018-11-24 LAB — CMP (CANCER CENTER ONLY)
ALT: 19 U/L (ref 0–44)
AST: 24 U/L (ref 15–41)
Albumin: 3.8 g/dL (ref 3.5–5.0)
Alkaline Phosphatase: 35 U/L — ABNORMAL LOW (ref 38–126)
Anion gap: 5 (ref 5–15)
BUN: 10 mg/dL (ref 6–20)
CO2: 27 mmol/L (ref 22–32)
Calcium: 8.5 mg/dL — ABNORMAL LOW (ref 8.9–10.3)
Chloride: 107 mmol/L (ref 98–111)
Creatinine: 0.75 mg/dL (ref 0.44–1.00)
GFR, Est AFR Am: >60 mL/min (ref >60)
GFR, Estimated: >60 mL/min (ref >60)
Glucose, Bld: 88 mg/dL (ref 70–99)
Potassium: 4.2 mmol/L (ref 3.5–5.1)
Sodium: 139 mmol/L (ref 135–145)
Total Bilirubin: 1.1 mg/dL (ref 0.3–1.2)
Total Protein: 7.1 g/dL (ref 6.5–8.1)

## 2018-11-24 LAB — CBC WITH DIFFERENTIAL (CANCER CENTER ONLY)
Abs Immature Granulocytes: 0 10*3/uL (ref 0.00–0.07)
Basophils Absolute: 0 10*3/uL (ref 0.0–0.1)
Basophils Relative: 1 %
Eosinophils Absolute: 0.1 10*3/uL (ref 0.0–0.5)
Eosinophils Relative: 2 %
HCT: 39.6 % (ref 36.0–46.0)
Hemoglobin: 12.8 g/dL (ref 12.0–15.0)
Immature Granulocytes: 0 %
Lymphocytes Relative: 77 %
Lymphs Abs: 4 10*3/uL (ref 0.7–4.0)
MCH: 27.9 pg (ref 26.0–34.0)
MCHC: 32.3 g/dL (ref 30.0–36.0)
MCV: 86.5 fL (ref 80.0–100.0)
Monocytes Absolute: 0.4 10*3/uL (ref 0.1–1.0)
Monocytes Relative: 7 %
Neutro Abs: 0.7 10*3/uL — ABNORMAL LOW (ref 1.7–7.7)
Neutrophils Relative %: 13 %
Platelet Count: 144 10*3/uL — ABNORMAL LOW (ref 150–400)
RBC: 4.58 MIL/uL (ref 3.87–5.11)
RDW: 13.8 % (ref 11.5–15.5)
WBC Count: 5.2 10*3/uL (ref 4.0–10.5)
nRBC: 0 % (ref 0.0–0.2)

## 2018-11-24 LAB — SAVE SMEAR(SSMR), FOR PROVIDER SLIDE REVIEW

## 2018-11-24 MED ORDER — FAMCICLOVIR 500 MG PO TABS: 1000.0000 mg | ORAL_TABLET | Freq: Two times a day (BID) | ORAL | 0 refills | Status: DC

## 2018-11-24 NOTE — Progress Notes (Signed)
Hematology and Oncology Follow Up Visit  Monique Richardson [id number] 1965-10-06 53 y.o. 11/24/2018   Principle Diagnosis:  Chronic leukopenia   Current Therapy:    Observation     Interim History:  Monique Richardson is back for follow-up.  She has now moved back to [place].  She moved back here back in April.  We have not seen her since September 2018.  She is not too happy about being up in [facility].  Not much really happens up there.  Is been a stressful year for him.  Her dad passed away up in [place].  He had the coronavirus.  He was 51 years old but had bad dementia.  Her dog passed away 2 days before minded.  We talked about this.  She says that her rash developed overnight on her right hip area.  I looked at it.  It looks like shingles.  It looks localized.  I would like to see if there is any spread out a nerve root or dermatome.  I do not see that.  I will put her on Famvir thousand milligrams p.o. twice daily x7 days.  Otherwise, she is had no problem with infections.  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  Her white cell count is fine today but her neutrophils only 700.  She has had no problems with cough.  There is no change in bowel or bladder habits.  She has had no obvious fever.  She has lost a little bit of weight.  Overall, her performance status is ECOG 1. Overall, her performance status is ECOG 1.   Medications:  Current Outpatient Medications:  .  EPINEPHrine 0.3 mg/0.3 mL IJ SOAJ injection, Inject into the muscle., Disp: , Rfl:   Allergies:  Allergies  Allergen Reactions  . Bee Venom Anaphylaxis    Past Medical History, Surgical history, Social history, and Family History were reviewed and updated.  Review of Systems: Review of Systems  Constitutional: Negative.   HENT: Negative.   Eyes: Negative.   Respiratory: Negative.   Cardiovascular: Negative.   Gastrointestinal: Negative.   Genitourinary: Negative.   Musculoskeletal: Negative.    Skin: Positive for rash.  Neurological: Negative.   Endo/Heme/Allergies: Negative.   Psychiatric/Behavioral: Negative.      Physical Exam:  weight is 159 lb (72.1 kg). Her oral temperature is 98.7 F (37.1 C). Her blood pressure is 116/69 and her pulse is 55 (abnormal). Her respiration is 19 and oxygen saturation is 100%.   Wt Readings from Last 3 Encounters:  11/24/18 159 lb (72.1 kg)  01/15/17 168 lb (76.2 kg)  01/21/16 176 lb (79.8 kg)     Physical Exam Vitals signs reviewed.  HENT:     Head: Normocephalic and atraumatic.  Eyes:     Pupils: Pupils are equal, round, and reactive to light.  Neck:     Musculoskeletal: Normal range of motion.  Cardiovascular:     Rate and Rhythm: Normal rate and regular rhythm.     Heart sounds: Normal heart sounds.  Pulmonary:     Effort: Pulmonary effort is normal.     Breath sounds: Normal breath sounds.  Abdominal:     General: Bowel sounds are normal.     Palpations: Abdomen is soft.  Musculoskeletal: Normal range of motion.        General: No tenderness or deformity.  Lymphadenopathy:     Cervical: No cervical adenopathy.  Skin:    General: Skin is warm and dry.  Findings: No erythema or rash.     Comments: She has a localized area of a vesicular type rash in the right lateral hip.  There is might be in the L1 dermatome.  Neurological:     Mental Status: She is alert and oriented to person, place, and time.  Psychiatric:        Behavior: Behavior normal.        Thought Content: Thought content normal.        Judgment: Judgment normal.       Chemistry      Component Value Date/Time   NA 139 11/24/2018 1336   NA 139 01/15/2017 1419   K 4.2 11/24/2018 1336   K 3.9 01/15/2017 1419   CL 107 11/24/2018 1336   CL 104 01/15/2017 1419   CO2 27 11/24/2018 1336   CO2 26 01/15/2017 1419   BUN 10 11/24/2018 1336   BUN 16 01/15/2017 1419   CREATININE 0.75 11/24/2018 1336   CREATININE 0.81 01/15/2017 1419      Component  Value Date/Time   CALCIUM 8.5 (L) 11/24/2018 1336   CALCIUM 9.8 01/15/2017 1419   ALKPHOS 35 (L) 11/24/2018 1336   ALKPHOS 56 01/15/2017 1419   AST 24 11/24/2018 1336   ALT 19 11/24/2018 1336   BILITOT 1.1 11/24/2018 1336         Impression and Plan: Monique Richardson is a 53 year old white female. She has chronic leukopenia. [name] for several years.   Overall, everything is holding pretty steady.  Again it looks like she may have a little bit of shingles over the right hip area.  This might be the L1 dermatome.  I thought it would be best to be proactive to make sure we treated this before it became a real problem.  I still have not had a bone marrow biopsy done on her yet.  I think this might be something to think about in the future.  I would like to see her back in 6 months.  If she has problems with this rash, we can certainly get her to her family doctor or a dermatologist.    Volanda Napoleon, MD 7/16/20202:47 PM

## 2018-12-12 ENCOUNTER — Other Ambulatory Visit: Payer: Self-pay | Admitting: Obstetrics and Gynecology

## 2018-12-12 DIAGNOSIS — R928 Other abnormal and inconclusive findings on diagnostic imaging of breast: Secondary | ICD-10-CM

## 2018-12-15 ENCOUNTER — Other Ambulatory Visit: Payer: BC Managed Care – PPO

## 2018-12-16 ENCOUNTER — Other Ambulatory Visit: Payer: BC Managed Care – PPO

## 2018-12-19 ENCOUNTER — Ambulatory Visit
Admission: RE | Admit: 2018-12-19 | Discharge: 2018-12-19 | Disposition: A | Discharge status: Home / Self Care | Payer: BC Managed Care – PPO | Source: Ambulatory Visit | Attending: Obstetrics and Gynecology | Admitting: Obstetrics and Gynecology

## 2018-12-19 ENCOUNTER — Other Ambulatory Visit: Payer: Self-pay

## 2018-12-19 DIAGNOSIS — R928 Other abnormal and inconclusive findings on diagnostic imaging of breast: Secondary | ICD-10-CM

## 2019-05-26 ENCOUNTER — Ambulatory Visit: Payer: BC Managed Care – PPO | Admitting: Family

## 2019-05-26 ENCOUNTER — Other Ambulatory Visit: Payer: BC Managed Care – PPO

## 2019-06-20 ENCOUNTER — Inpatient Hospital Stay: Payer: BC Managed Care – PPO | Admitting: Family

## 2019-06-20 ENCOUNTER — Inpatient Hospital Stay: Payer: BC Managed Care – PPO

## 2019-06-30 ENCOUNTER — Inpatient Hospital Stay: Payer: BC Managed Care – PPO | Admitting: Family

## 2019-06-30 ENCOUNTER — Inpatient Hospital Stay: Payer: BC Managed Care – PPO

## 2019-07-14 ENCOUNTER — Inpatient Hospital Stay (HOSPITAL_BASED_OUTPATIENT_CLINIC_OR_DEPARTMENT_OTHER): Payer: BC Managed Care – PPO | Admitting: Family

## 2019-07-14 ENCOUNTER — Other Ambulatory Visit: Payer: Self-pay

## 2019-07-14 ENCOUNTER — Encounter: Payer: Self-pay | Admitting: Family

## 2019-07-14 ENCOUNTER — Inpatient Hospital Stay: Payer: BC Managed Care – PPO | Attending: Family

## 2019-07-14 VITALS — BP 130/85 | HR 50 | Temp 97.1°F | Resp 18 | Ht 68.0 in | Wt 163.4 lb

## 2019-07-14 DIAGNOSIS — D729 Disorder of white blood cells, unspecified: Secondary | ICD-10-CM

## 2019-07-14 DIAGNOSIS — Z8616 Personal history of COVID-19: Secondary | ICD-10-CM | POA: Insufficient documentation

## 2019-07-14 DIAGNOSIS — D72819 Decreased white blood cell count, unspecified: Secondary | ICD-10-CM | POA: Diagnosis present

## 2019-07-14 LAB — CBC WITH DIFFERENTIAL (CANCER CENTER ONLY)
Abs Immature Granulocytes: 0 10*3/uL (ref 0.00–0.07)
Basophils Absolute: 0 10*3/uL (ref 0.0–0.1)
Basophils Relative: 1 %
Eosinophils Absolute: 0.1 10*3/uL (ref 0.0–0.5)
Eosinophils Relative: 1 %
HCT: 39 % (ref 36.0–46.0)
Hemoglobin: 12.7 g/dL (ref 12.0–15.0)
Immature Granulocytes: 0 %
Lymphocytes Relative: 74 %
Lymphs Abs: 2.9 10*3/uL (ref 0.7–4.0)
MCH: 27.5 pg (ref 26.0–34.0)
MCHC: 32.6 g/dL (ref 30.0–36.0)
MCV: 84.6 fL (ref 80.0–100.0)
Monocytes Absolute: 0.2 10*3/uL (ref 0.1–1.0)
Monocytes Relative: 6 %
Neutro Abs: 0.7 10*3/uL — ABNORMAL LOW (ref 1.7–7.7)
Neutrophils Relative %: 18 %
Platelet Count: 129 10*3/uL — ABNORMAL LOW (ref 150–400)
RBC: 4.61 MIL/uL (ref 3.87–5.11)
RDW: 12.7 % (ref 11.5–15.5)
WBC Count: 3.8 10*3/uL — ABNORMAL LOW (ref 4.0–10.5)
nRBC: 0 % (ref 0.0–0.2)

## 2019-07-14 LAB — CMP (CANCER CENTER ONLY)
ALT: 24 U/L (ref 0–44)
AST: 30 U/L (ref 15–41)
Albumin: 4.1 g/dL (ref 3.5–5.0)
Alkaline Phosphatase: 33 U/L — ABNORMAL LOW (ref 38–126)
Anion gap: 6 (ref 5–15)
BUN: 16 mg/dL (ref 6–20)
CO2: 29 mmol/L (ref 22–32)
Calcium: 9.9 mg/dL (ref 8.9–10.3)
Chloride: 106 mmol/L (ref 98–111)
Creatinine: 0.8 mg/dL (ref 0.44–1.00)
GFR, Est AFR Am: >60 mL/min (ref >60)
GFR, Estimated: >60 mL/min (ref >60)
Glucose, Bld: 89 mg/dL (ref 70–99)
Potassium: 4.2 mmol/L (ref 3.5–5.1)
Sodium: 141 mmol/L (ref 135–145)
Total Bilirubin: 1.1 mg/dL (ref 0.3–1.2)
Total Protein: 7.9 g/dL (ref 6.5–8.1)

## 2019-07-14 LAB — SAVE SMEAR(SSMR), FOR PROVIDER SLIDE REVIEW

## 2019-07-14 NOTE — Progress Notes (Signed)
Hematology and Oncology Follow Up Visit  Monique Richardson [id number] 1966-02-06 54 y.o. 07/14/2019   Principle Diagnosis:  Chronic leukopenia  Current Therapy:   Observation   Interim History:  Monique Richardson is here today for follow-up. She is doing well and has recuperated from having Covid in January. She still does not have her taste and smell back fully and has had some headaches that come and go.  No c/o fatigue. She has had some occasional brain fog.  No fever, chills, n/v, cough, rash, dizziness, SOB, chest pain, palpitations, abdominal pain or changes in bowel or bladder habits.  No swelling, tenderness, numbness or tingling in her extremities.  No falls or syncopal episodes.  She has maintained a good appetite and is staying well hydrated. Her weight is stable.   ECOG Performance Status: 0 - Asymptomatic  Medications:  Allergies as of 07/14/2019      Reactions   Bee Venom Anaphylaxis      Medication List       Accurate as of July 14, 2019  3:02 PM. If you have any questions, ask your nurse or doctor.        EPINEPHrine 0.3 mg/0.3 mL Soaj injection Commonly known as: EPI-PEN Inject into the muscle.   famciclovir 500 MG tablet Commonly known as: FAMVIR Take 2 tablets (1,000 mg total) by mouth 2 (two) times daily.       Allergies:  Allergies  Allergen Reactions  . Bee Venom Anaphylaxis    Past Medical History, Surgical history, Social history, and Family History were reviewed and updated.  Review of Systems: All other 10 point review of systems is negative.   Physical Exam:  vitals were not taken for this visit.   Wt Readings from Last 3 Encounters:  11/24/18 159 lb (72.1 kg)  01/15/17 168 lb (76.2 kg)  01/21/16 176 lb (79.8 kg)    Ocular: Sclerae unicteric, pupils equal, round and reactive to light Ear-nose-throat: Oropharynx clear, dentition fair Lymphatic: No cervical or supraclavicular adenopathy Lungs no rales or rhonchi, good excursion  bilaterally Heart regular rate and rhythm, no murmur appreciated Abd soft, nontender, positive bowel sounds, no liver or spleen tip palpated on exam, no fluid wave  MSK no focal spinal tenderness, no joint edema Neuro: non-focal, well-oriented, appropriate affect Breasts: Deferred   Lab Results  Component Value Date   WBC 5.2 11/24/2018   HGB 12.8 11/24/2018   HCT 39.6 11/24/2018   MCV 86.5 11/24/2018   PLT 144 (L) 11/24/2018   Lab Results  Component Value Date   FERRITIN 71 01/15/2017   IRON 82 01/15/2017   TIBC 276 01/15/2017   UIBC 194 01/15/2017   IRONPCTSAT 30 01/15/2017   Lab Results  Component Value Date   RBC 4.58 11/24/2018   No results found for: KPAFRELGTCHN, LAMBDASER, KAPLAMBRATIO No results found for: IGGSERUM, IGA, IGMSERUM No results found for: Odetta Pink, SPEI   Chemistry      Component Value Date/Time   NA 139 11/24/2018 1336   NA 139 01/15/2017 1419   K 4.2 11/24/2018 1336   K 3.9 01/15/2017 1419   CL 107 11/24/2018 1336   CL 104 01/15/2017 1419   CO2 27 11/24/2018 1336   CO2 26 01/15/2017 1419   BUN 10 11/24/2018 1336   BUN 16 01/15/2017 1419   CREATININE 0.75 11/24/2018 1336   CREATININE 0.81 01/15/2017 1419      Component Value Date/Time   CALCIUM 8.5 (  L) 11/24/2018 1336   CALCIUM 9.8 01/15/2017 1419   ALKPHOS 35 (L) 11/24/2018 1336   ALKPHOS 56 01/15/2017 1419   AST 24 11/24/2018 1336   ALT 19 11/24/2018 1336   BILITOT 1.1 11/24/2018 1336       Impression and Plan: Monique Richardson is a very pleasant 54 yo caucasian female with chronic leukopenia.  I did go over her lab work with Dr. Marin Olp. Lymphocyte count is holding steady.  No bone marrow biopsy needed at this time.  We will plan to see her back in another 6 months.  She will contact our office with any questions or concerns. We can certainly see her sooner if needed.   Laverna Peace, NP 3/5/20213:02 PM

## 2019-07-18 ENCOUNTER — Telehealth: Payer: Self-pay | Admitting: Hematology & Oncology

## 2019-07-18 NOTE — Telephone Encounter (Signed)
Appointments scheduled calendar mailed per 3/5 los

## 2020-01-16 ENCOUNTER — Telehealth: Payer: Self-pay | Admitting: Hematology & Oncology

## 2020-01-16 NOTE — Telephone Encounter (Signed)
Returned call to patient she was requesting to R/S her 9/8 appointment.  I asked that she call me back to do so at my direct number

## 2020-01-17 ENCOUNTER — Inpatient Hospital Stay: Payer: BC Managed Care – PPO | Admitting: Hematology & Oncology

## 2020-01-17 ENCOUNTER — Inpatient Hospital Stay: Payer: BC Managed Care – PPO

## 2020-01-27 IMAGING — MG DIGITAL DIAGNOSTIC UNILATERAL RIGHT MAMMOGRAM WITH TOMO AND CAD
6 series · 6 of 18 positions shown · non-contrast
Comparison: Previous exam(s).

CLINICAL DATA: Patient presents as a recall from screening for
possible right breast mass.

EXAM:
DIGITAL DIAGNOSTIC RIGHT MAMMOGRAM WITH TOMO
ULTRASOUND RIGHT BREAST

[R CC synth-2D]
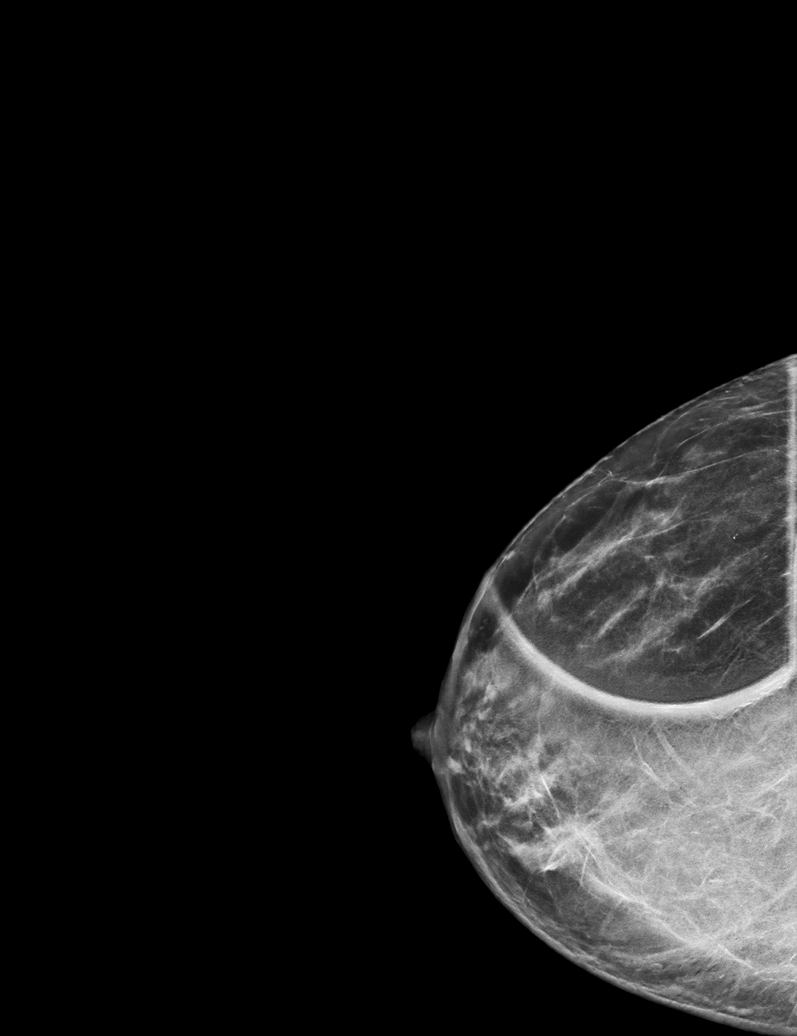

[R ML synth-2D]
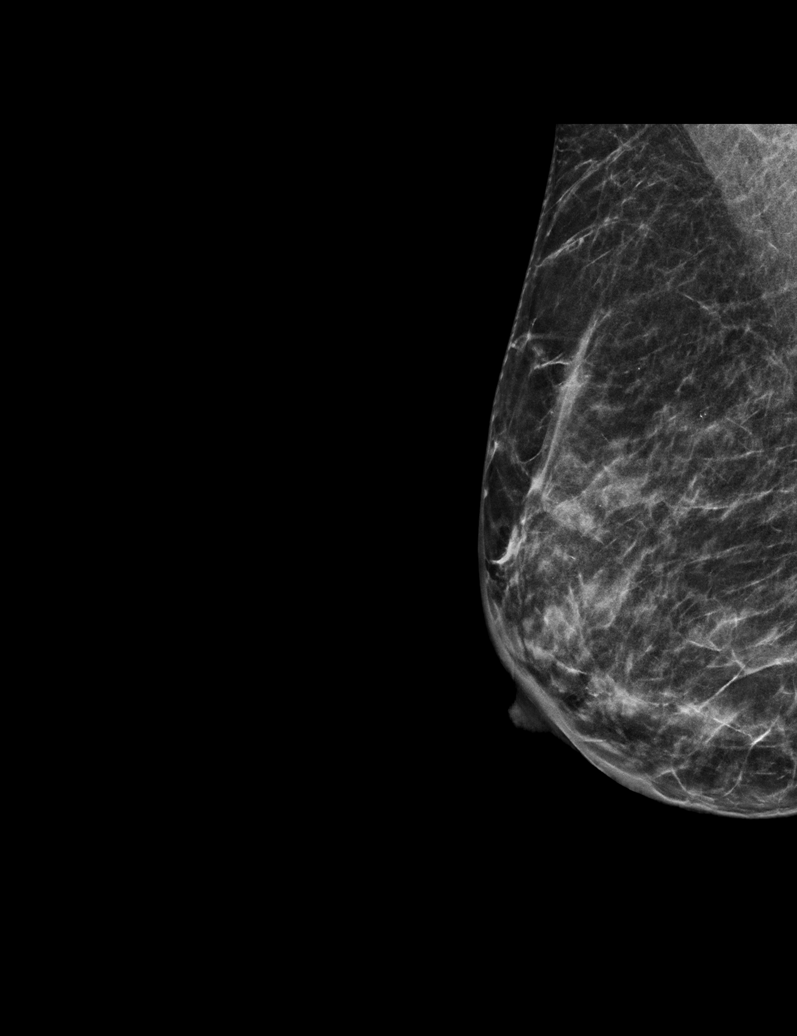

[R MLO synth-2D]
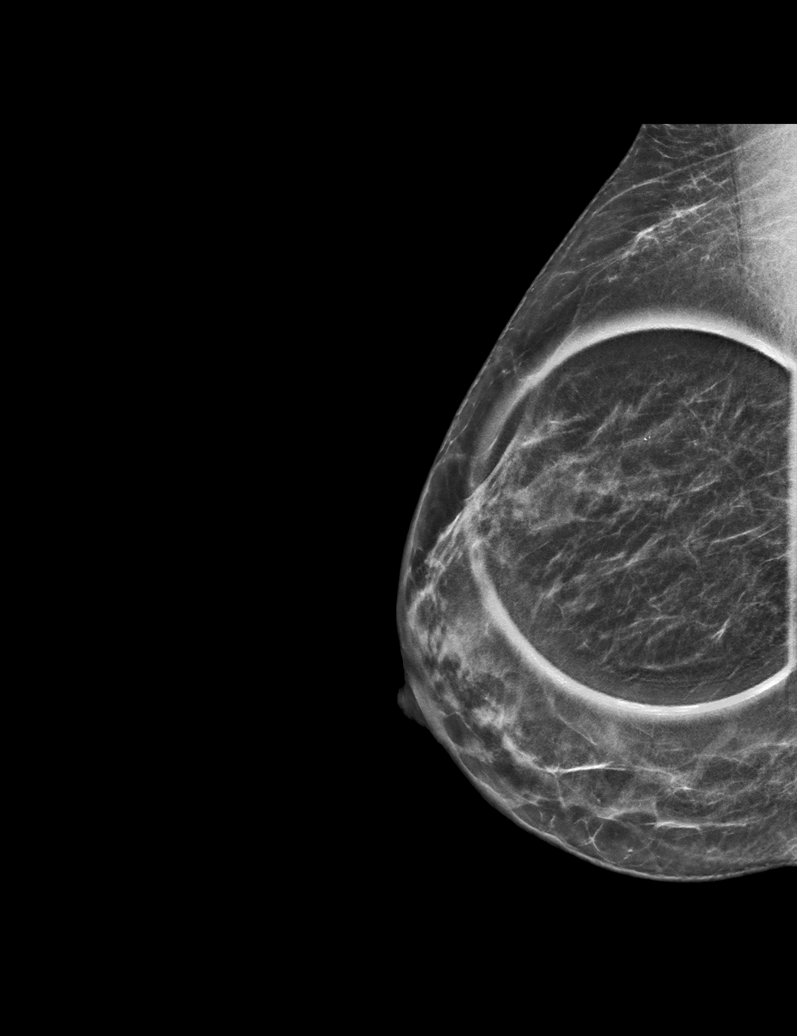

[R MLO tomo · tomo slice 26/51.0]
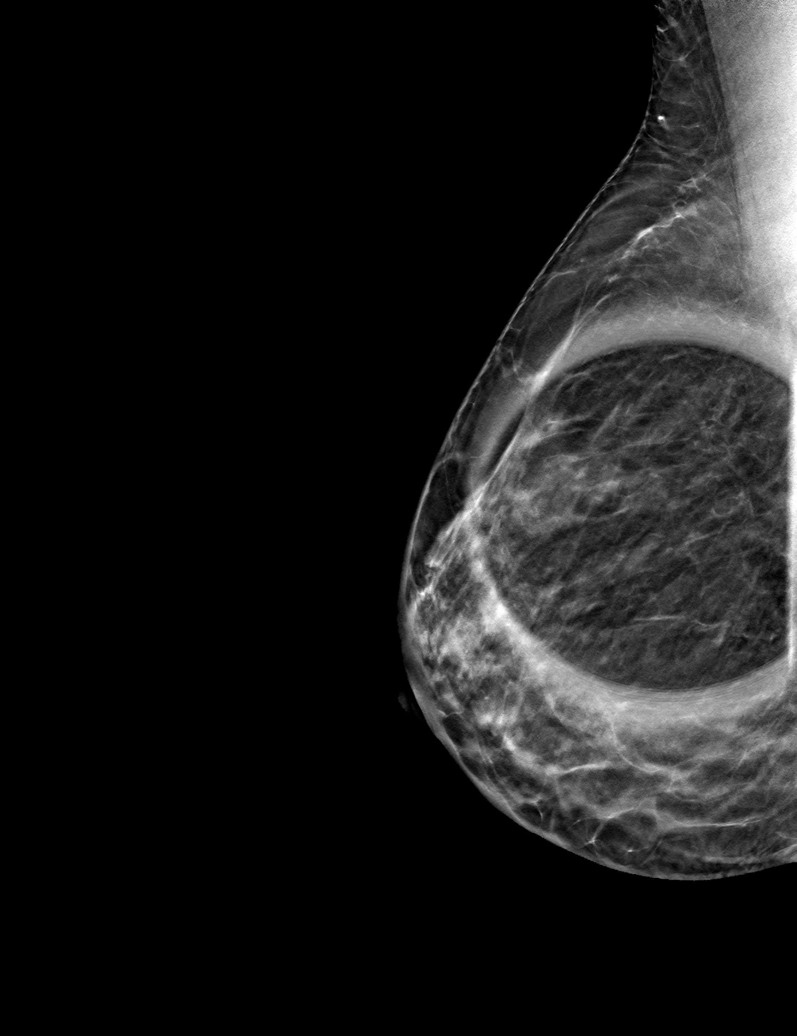

[R CC tomo · tomo slice 25/50.0]
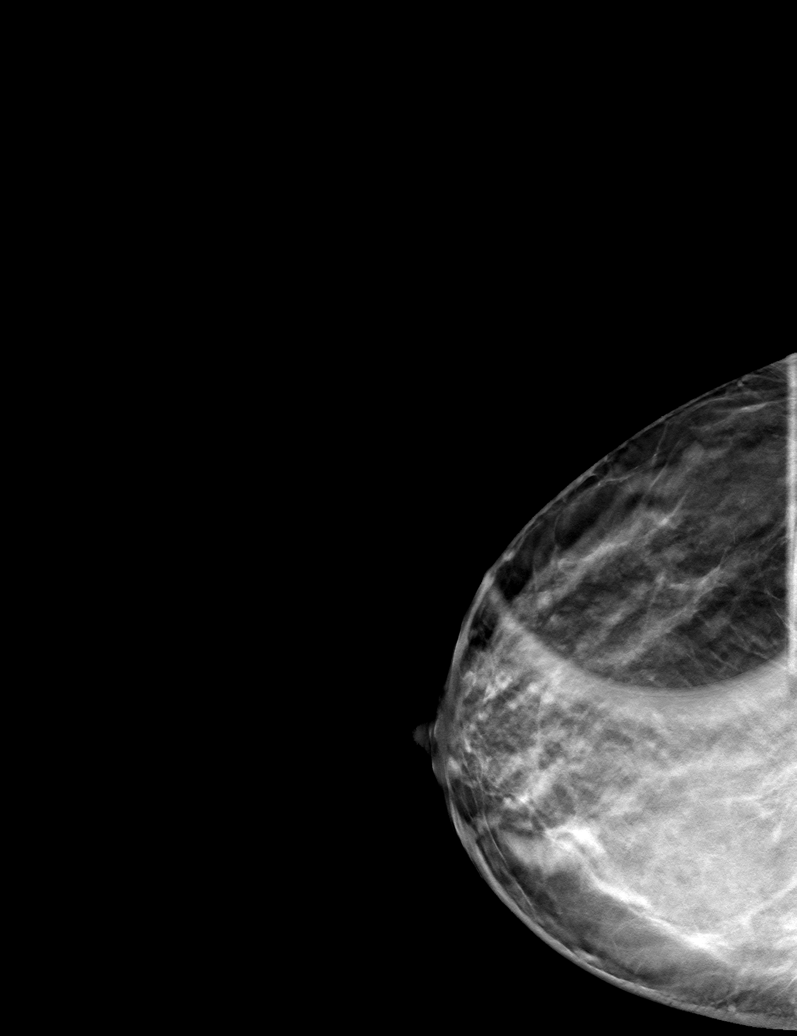

[R ML tomo · tomo slice 26/51.0]
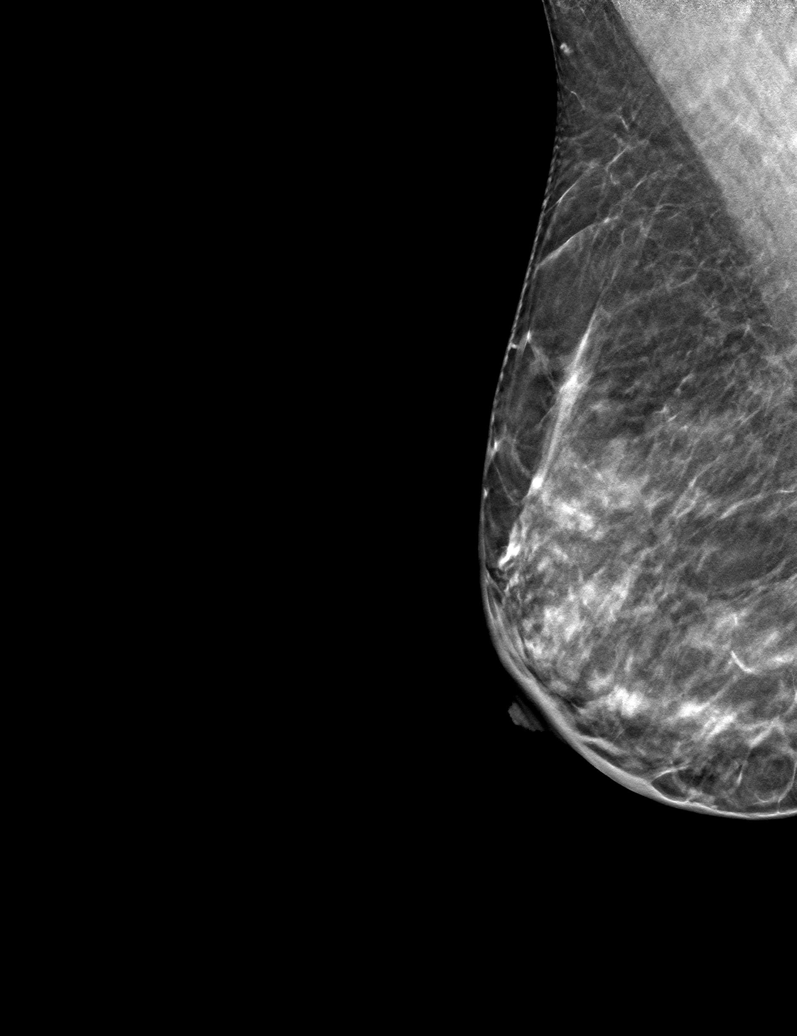

[6 of 18 positions shown; findings below may reference images not displayed]

ACR Breast Density Category b: There are scattered areas of
fibroglandular density.
FINDINGS: Mammogram: Spot compression tomosynthesis views were performed in
the outer right breast at the questionable site demonstrating
persistence of a small oval mass measuring approximately 5 mm. An
additional similar-appearing small mass just medial to this is also
identified in the lateral breast.

Targeted ultrasound is performed, showing an oval circumscribed
anechoic mass with posterior enhancement at 10 o'clock 5 cm from the
nipple measuring 6 x 3 x 5 mm. An additional oval circumscribed
anechoic mass is also identified at 9:30 o'clock 4 cm from the
nipple measuring 5 x 4 x 4 mm. These correspond to the mammographic
findings. No suspicious cystic or solid mass identified.
IMPRESSION: Two simple cysts in the lateral right breast. No mammographic or
sonographic evidence of malignancy.

RECOMMENDATION:
Screening mammogram in one year.(Code:OQ-[id number])

I have discussed the findings and recommendations with the patient.
Results were also provided in writing at the conclusion of the
visit. If applicable, a reminder letter will be sent to the patient
regarding the next appointment.

BI-RADS CATEGORY  2: Benign.

## 2020-02-19 ENCOUNTER — Inpatient Hospital Stay (HOSPITAL_BASED_OUTPATIENT_CLINIC_OR_DEPARTMENT_OTHER): Payer: BC Managed Care – PPO | Admitting: Hematology & Oncology

## 2020-02-19 ENCOUNTER — Inpatient Hospital Stay: Payer: BC Managed Care – PPO | Attending: Hematology & Oncology

## 2020-02-19 ENCOUNTER — Telehealth: Payer: Self-pay | Admitting: Hematology & Oncology

## 2020-02-19 ENCOUNTER — Encounter: Payer: Self-pay | Admitting: Hematology & Oncology

## 2020-02-19 ENCOUNTER — Other Ambulatory Visit: Payer: Self-pay

## 2020-02-19 DIAGNOSIS — D72819 Decreased white blood cell count, unspecified: Secondary | ICD-10-CM | POA: Insufficient documentation

## 2020-02-19 DIAGNOSIS — R930 Abnormal findings on diagnostic imaging of skull and head, not elsewhere classified: Secondary | ICD-10-CM

## 2020-02-19 DIAGNOSIS — J329 Chronic sinusitis, unspecified: Secondary | ICD-10-CM | POA: Diagnosis not present

## 2020-02-19 DIAGNOSIS — D729 Disorder of white blood cells, unspecified: Secondary | ICD-10-CM

## 2020-02-19 LAB — CMP (CANCER CENTER ONLY)
ALT: 15 U/L (ref 0–44)
AST: 23 U/L (ref 15–41)
Albumin: 3.6 g/dL (ref 3.5–5.0)
Alkaline Phosphatase: 35 U/L — ABNORMAL LOW (ref 38–126)
Anion gap: 3 — ABNORMAL LOW (ref 5–15)
BUN: 11 mg/dL (ref 6–20)
CO2: 30 mmol/L (ref 22–32)
Calcium: 9.4 mg/dL (ref 8.9–10.3)
Chloride: 107 mmol/L (ref 98–111)
Creatinine: 0.73 mg/dL (ref 0.44–1.00)
GFR, Estimated: >60 mL/min (ref >60)
Glucose, Bld: 95 mg/dL (ref 70–99)
Potassium: 4.5 mmol/L (ref 3.5–5.1)
Sodium: 140 mmol/L (ref 135–145)
Total Bilirubin: 0.9 mg/dL (ref 0.3–1.2)
Total Protein: 7.1 g/dL (ref 6.5–8.1)

## 2020-02-19 LAB — CBC WITH DIFFERENTIAL (CANCER CENTER ONLY)
Abs Immature Granulocytes: 0 10*3/uL (ref 0.00–0.07)
Basophils Absolute: 0.1 10*3/uL (ref 0.0–0.1)
Basophils Relative: 1 %
Eosinophils Absolute: 0.1 10*3/uL (ref 0.0–0.5)
Eosinophils Relative: 2 %
HCT: 38.7 % (ref 36.0–46.0)
Hemoglobin: 12.8 g/dL (ref 12.0–15.0)
Immature Granulocytes: 0 %
Lymphocytes Relative: 79 %
Lymphs Abs: 2.9 10*3/uL (ref 0.7–4.0)
MCH: 28.1 pg (ref 26.0–34.0)
MCHC: 33.1 g/dL (ref 30.0–36.0)
MCV: 84.9 fL (ref 80.0–100.0)
Monocytes Absolute: 0.3 10*3/uL (ref 0.1–1.0)
Monocytes Relative: 9 %
Neutro Abs: 0.4 10*3/uL — CL (ref 1.7–7.7)
Neutrophils Relative %: 9 %
Platelet Count: 132 10*3/uL — ABNORMAL LOW (ref 150–400)
RBC: 4.56 MIL/uL (ref 3.87–5.11)
RDW: 12.9 % (ref 11.5–15.5)
WBC Count: 3.7 10*3/uL — ABNORMAL LOW (ref 4.0–10.5)
nRBC: 0 % (ref 0.0–0.2)

## 2020-02-19 LAB — SAVE SMEAR(SSMR), FOR PROVIDER SLIDE REVIEW

## 2020-02-19 NOTE — Progress Notes (Signed)
Hematology and Oncology Follow Up Visit  Monique Richardson [id number] March 14, 1966 53 y.o. 02/19/2020   Principle Diagnosis:  Chronic leukopenia   Current Therapy:    Observation     Interim History:  Monique Richardson is back for follow-up.  She is under a lot of stress.  Her son has schizophrenia.  He is not able to work.  He is at home.  Every day is different.  She is just trying her best to help him.  She is complaining of a lot of pain in the sinuses.  She thinks has been on antibiotics in the past.  She says the sinuses bother her.  She has no visual changes.  She has had no fever.  She has had intermittent headaches.  There is been no problem with bleeding.  She has had no rashes.  She has had no leg swelling.  She did have some shingles over in the left L1 dermatome.  She has gone through this.  She and her husband to celebrate their 30th wedding anniversary.  I am happy that they could do this.    Overall, her performance status is ECOG 1.   Medications:  Current Outpatient Medications:  .  EPINEPHrine 0.3 mg/0.3 mL IJ SOAJ injection, Inject into the muscle., Disp: , Rfl:   Allergies:  Allergies  Allergen Reactions  . Bee Venom Anaphylaxis    Past Medical History, Surgical history, Social history, and Family History were reviewed and updated.  Review of Systems: Review of Systems  Constitutional: Negative.   HENT: Negative.   Eyes: Negative.   Respiratory: Negative.   Cardiovascular: Negative.   Gastrointestinal: Negative.   Genitourinary: Negative.   Musculoskeletal: Negative.   Skin: Positive for rash.  Neurological: Negative.   Endo/Heme/Allergies: Negative.   Psychiatric/Behavioral: Negative.      Physical Exam:  weight is 167 lb (75.8 kg). Her oral temperature is 98.7 F (37.1 C). Her blood pressure is 118/44 (abnormal). Her respiration is 18 and oxygen saturation is 100%.   Wt Readings from Last 3 Encounters:  02/19/20 167 lb (75.8 kg)  07/14/19 163  lb 6.4 oz (74.1 kg)  11/24/18 159 lb (72.1 kg)     Physical Exam Vitals reviewed.  HENT:     Head: Normocephalic and atraumatic.  Eyes:     Pupils: Pupils are equal, round, and reactive to light.  Cardiovascular:     Rate and Rhythm: Normal rate and regular rhythm.     Heart sounds: Normal heart sounds.  Pulmonary:     Effort: Pulmonary effort is normal.     Breath sounds: Normal breath sounds.  Abdominal:     General: Bowel sounds are normal.     Palpations: Abdomen is soft.  Musculoskeletal:        General: No tenderness or deformity. Normal range of motion.     Cervical back: Normal range of motion.  Lymphadenopathy:     Cervical: No cervical adenopathy.  Skin:    General: Skin is warm and dry.     Findings: No erythema or rash.     Comments: She has a localized area of a vesicular type rash in the right lateral hip.  There is might be in the L1 dermatome.  Neurological:     Mental Status: She is alert and oriented to person, place, and time.  Psychiatric:        Behavior: Behavior normal.        Thought Content: Thought content normal.  Judgment: Judgment normal.       Chemistry      Component Value Date/Time   NA 140 02/19/2020 0901   NA 139 01/15/2017 1419   K 4.5 02/19/2020 0901   K 3.9 01/15/2017 1419   CL 107 02/19/2020 0901   CL 104 01/15/2017 1419   CO2 30 02/19/2020 0901   CO2 26 01/15/2017 1419   BUN 11 02/19/2020 0901   BUN 16 01/15/2017 1419   CREATININE 0.73 02/19/2020 0901   CREATININE 0.81 01/15/2017 1419      Component Value Date/Time   CALCIUM 9.4 02/19/2020 0901   CALCIUM 9.8 01/15/2017 1419   ALKPHOS 35 (L) 02/19/2020 0901   ALKPHOS 56 01/15/2017 1419   AST 23 02/19/2020 0901   ALT 15 02/19/2020 0901   BILITOT 0.9 02/19/2020 0901      Impression and Plan: Monique Richardson is a 54 year old white female. She has chronic leukopenia. [name] for several years.   I am a little bit troubled by this sinus pain that she  has.  Given her leukopenia, she might have a sinus infection with an atypical organism.  I really think that a CT scan would be helpful.  If we find that she does have sinus issues or sinus fullness on the CT scan, then she probably needs to go to ENT for an evaluation.  I really would like to get her back to see [place] in about 2 months.  I want to make sure that everything looks okay and that she is doing better.  I have yet to do a bone marrow test on her.  This is certainly becoming much more of a possibility.     Volanda Napoleon, MD 10/11/202110:17 AM

## 2020-02-19 NOTE — Telephone Encounter (Signed)
Appointments scheduled and patinet was sent My Chart Access code per her request.  She will get appointment info there once she gets home per 10/11 los

## 2020-02-22 ENCOUNTER — Ambulatory Visit (HOSPITAL_BASED_OUTPATIENT_CLINIC_OR_DEPARTMENT_OTHER)
Admission: RE | Admit: 2020-02-22 | Discharge: 2020-02-22 | Disposition: A | Discharge status: Home / Self Care | Payer: BC Managed Care – PPO | Source: Ambulatory Visit | Attending: Hematology & Oncology | Admitting: Hematology & Oncology

## 2020-02-22 ENCOUNTER — Telehealth: Payer: Self-pay | Admitting: *Deleted

## 2020-02-22 ENCOUNTER — Other Ambulatory Visit: Payer: Self-pay

## 2020-02-22 DIAGNOSIS — R930 Abnormal findings on diagnostic imaging of skull and head, not elsewhere classified: Secondary | ICD-10-CM | POA: Insufficient documentation

## 2020-02-22 NOTE — Telephone Encounter (Signed)
-----   Message from Volanda Napoleon, MD sent at 02/22/2020 11:31 AM EDT ----- Call - the sinuses actually look ok.  There is some mucosal thickening.No actual infection.  Laurey Arrow

## 2020-02-22 NOTE — Telephone Encounter (Signed)
Patient notified per order of Dr. Marin Olp that "the sinuses actually look ok.  There is some mucosal thickening.  No actual infection."  Pt appreciative of call and would like to know what to do next for headaches.  Pt notified per order of Dr. Marin Olp to contact her PCP regarding ongoing headaches.  Pt appreciative of assistance and has no further questions at this time.

## 2020-04-10 ENCOUNTER — Encounter: Payer: Self-pay | Admitting: Hematology & Oncology

## 2020-04-10 ENCOUNTER — Other Ambulatory Visit: Payer: Self-pay

## 2020-04-10 ENCOUNTER — Telehealth: Payer: Self-pay | Admitting: Hematology & Oncology

## 2020-04-10 ENCOUNTER — Inpatient Hospital Stay: Payer: BC Managed Care – PPO | Attending: Hematology & Oncology

## 2020-04-10 ENCOUNTER — Inpatient Hospital Stay (HOSPITAL_BASED_OUTPATIENT_CLINIC_OR_DEPARTMENT_OTHER): Payer: BC Managed Care – PPO | Admitting: Hematology & Oncology

## 2020-04-10 VITALS — BP 115/66 | HR 64 | Temp 97.8°F | Resp 20 | Wt 165.0 lb

## 2020-04-10 DIAGNOSIS — D72819 Decreased white blood cell count, unspecified: Secondary | ICD-10-CM | POA: Insufficient documentation

## 2020-04-10 DIAGNOSIS — F209 Schizophrenia, unspecified: Secondary | ICD-10-CM | POA: Diagnosis not present

## 2020-04-10 DIAGNOSIS — R930 Abnormal findings on diagnostic imaging of skull and head, not elsewhere classified: Secondary | ICD-10-CM

## 2020-04-10 DIAGNOSIS — D729 Disorder of white blood cells, unspecified: Secondary | ICD-10-CM

## 2020-04-10 LAB — CMP (CANCER CENTER ONLY)
ALT: 16 U/L (ref 0–44)
AST: 26 U/L (ref 15–41)
Albumin: 3.5 g/dL (ref 3.5–5.0)
Alkaline Phosphatase: 42 U/L (ref 38–126)
Anion gap: 1 — ABNORMAL LOW (ref 5–15)
BUN: 9 mg/dL (ref 6–20)
CO2: 31 mmol/L (ref 22–32)
Calcium: 9.2 mg/dL (ref 8.9–10.3)
Chloride: 106 mmol/L (ref 98–111)
Creatinine: 0.68 mg/dL (ref 0.44–1.00)
GFR, Estimated: >60 mL/min (ref >60)
Glucose, Bld: 109 mg/dL — ABNORMAL HIGH (ref 70–99)
Potassium: 4.2 mmol/L (ref 3.5–5.1)
Sodium: 138 mmol/L (ref 135–145)
Total Bilirubin: 0.7 mg/dL (ref 0.3–1.2)
Total Protein: 7.1 g/dL (ref 6.5–8.1)

## 2020-04-10 LAB — CBC WITH DIFFERENTIAL (CANCER CENTER ONLY)
Abs Immature Granulocytes: 0 10*3/uL (ref 0.00–0.07)
Basophils Absolute: 0 10*3/uL (ref 0.0–0.1)
Basophils Relative: 1 %
Eosinophils Absolute: 0.1 10*3/uL (ref 0.0–0.5)
Eosinophils Relative: 2 %
HCT: 39.6 % (ref 36.0–46.0)
Hemoglobin: 12.7 g/dL (ref 12.0–15.0)
Immature Granulocytes: 0 %
Lymphocytes Relative: 75 %
Lymphs Abs: 2.8 10*3/uL (ref 0.7–4.0)
MCH: 27.9 pg (ref 26.0–34.0)
MCHC: 32.1 g/dL (ref 30.0–36.0)
MCV: 86.8 fL (ref 80.0–100.0)
Monocytes Absolute: 0.3 10*3/uL (ref 0.1–1.0)
Monocytes Relative: 8 %
Neutro Abs: 0.5 10*3/uL — ABNORMAL LOW (ref 1.7–7.7)
Neutrophils Relative %: 14 %
Platelet Count: 109 10*3/uL — ABNORMAL LOW (ref 150–400)
RBC: 4.56 MIL/uL (ref 3.87–5.11)
RDW: 13.1 % (ref 11.5–15.5)
WBC Count: 3.7 10*3/uL — ABNORMAL LOW (ref 4.0–10.5)
nRBC: 0 % (ref 0.0–0.2)

## 2020-04-10 LAB — SAVE SMEAR(SSMR), FOR PROVIDER SLIDE REVIEW

## 2020-04-10 LAB — LACTATE DEHYDROGENASE: LDH: 148 U/L (ref 98–192)

## 2020-04-10 NOTE — Progress Notes (Signed)
Hematology and Oncology Follow Up Visit  Monique Richardson [id number] August 15, 1965 53 y.o. 04/10/2020   Principle Diagnosis:  Chronic leukopenia   Current Therapy:    Observation     Interim History:  [name] is back for follow-up.  I want her to come back earlier this because of the problems she was having with respect to her sinuses.  We did do a sinus x-ray on her.  This was a CT scan of the sinuses.  This was actually done on October 14.  The CT scan did not show anything that was obvious with respect to sinus infections.  She had a little bit of thickening of some of the sinuses but no actual air-fluid levels.  Think she is seen a ENT specialist who is helping out.  She is feeling better.  She still is under stress secondary to her son.  He is getting counseling for his schizophrenia.  She has had no problems infections.  She has had no fever.  She has had no bleeding.  Currently, her performance status is ECOG 1.     Medications:  Current Outpatient Medications:  .  fluticasone (FLONASE) 50 MCG/ACT nasal spray, Place 2 sprays into both nostrils daily., Disp: , Rfl:  .  montelukast (SINGULAIR) 10 MG tablet, Take 10 mg by mouth daily., Disp: , Rfl:  .  EPINEPHrine 0.3 mg/0.3 mL IJ SOAJ injection, Inject into the muscle., Disp: , Rfl:   Allergies:  Allergies  Allergen Reactions  . Bee Venom Anaphylaxis    Past Medical History, Surgical history, Social history, and Family History were reviewed and updated.  Review of Systems: Review of Systems  Constitutional: Negative.   HENT: Negative.   Eyes: Negative.   Respiratory: Negative.   Cardiovascular: Negative.   Gastrointestinal: Negative.   Genitourinary: Negative.   Musculoskeletal: Negative.   Skin: Positive for rash.  Neurological: Negative.   Endo/Heme/Allergies: Negative.   Psychiatric/Behavioral: Negative.      Physical Exam:  weight is 165 lb (74.8 kg). Her oral temperature is 97.8 F (36.6 C). Her blood  pressure is 115/66 and her pulse is 64. Her respiration is 20 and oxygen saturation is 100%.   Wt Readings from Last 3 Encounters:  04/10/20 165 lb (74.8 kg)  02/19/20 167 lb (75.8 kg)  07/14/19 163 lb 6.4 oz (74.1 kg)     Physical Exam Vitals reviewed.  HENT:     Head: Normocephalic and atraumatic.  Eyes:     Pupils: Pupils are equal, round, and reactive to light.  Cardiovascular:     Rate and Rhythm: Normal rate and regular rhythm.     Heart sounds: Normal heart sounds.  Pulmonary:     Effort: Pulmonary effort is normal.     Breath sounds: Normal breath sounds.  Abdominal:     General: Bowel sounds are normal.     Palpations: Abdomen is soft.  Musculoskeletal:        General: No tenderness or deformity. Normal range of motion.     Cervical back: Normal range of motion.  Lymphadenopathy:     Cervical: No cervical adenopathy.  Skin:    General: Skin is warm and dry.     Findings: No erythema or rash.     Comments: She has a localized area of a vesicular type rash in the right lateral hip.  There is might be in the L1 dermatome.  Neurological:     Mental Status: She is alert and oriented to person,  place, and time.  Psychiatric:        Behavior: Behavior normal.        Thought Content: Thought content normal.        Judgment: Judgment normal.       Chemistry      Component Value Date/Time   NA 138 04/10/2020 0852   NA 139 01/15/2017 1419   K 4.2 04/10/2020 0852   K 3.9 01/15/2017 1419   CL 106 04/10/2020 0852   CL 104 01/15/2017 1419   CO2 31 04/10/2020 0852   CO2 26 01/15/2017 1419   BUN 9 04/10/2020 0852   BUN 16 01/15/2017 1419   CREATININE 0.68 04/10/2020 0852   CREATININE 0.81 01/15/2017 1419      Component Value Date/Time   CALCIUM 9.2 04/10/2020 0852   CALCIUM 9.8 01/15/2017 1419   ALKPHOS 42 04/10/2020 0852   ALKPHOS 56 01/15/2017 1419   AST 26 04/10/2020 0852   ALT 16 04/10/2020 0852   BILITOT 0.7 04/10/2020 0852      Impression and  Plan: [name] is a 54 year old white female. She has chronic leukopenia. [name] for several years.   I am glad that she is doing better.  I am glad the CT scan did not show any obvious sinus infection.  We still have yet to do a bone marrow biopsy on her.  She has been asymptomatic with this leukopenia.  As such, I just want to hold off on being invasive.  I noted that her platelet count was a little bit lower today.  We will have to watch out with this.  I would like to get her through the Holidays.  I want to get her through the Winter.  I will plan to see her back in April.   Volanda Napoleon, MD 12/1/20219:49 AM

## 2020-04-10 NOTE — Telephone Encounter (Signed)
Appointments scheduled patient has My Chart Access per 12/1 los

## 2020-08-05 ENCOUNTER — Other Ambulatory Visit: Payer: Self-pay | Admitting: Orthopedic Surgery

## 2020-08-05 DIAGNOSIS — M25561 Pain in right knee: Secondary | ICD-10-CM

## 2020-08-05 DIAGNOSIS — S83411A Sprain of medial collateral ligament of right knee, initial encounter: Secondary | ICD-10-CM

## 2020-08-09 ENCOUNTER — Inpatient Hospital Stay: Payer: BC Managed Care – PPO | Admitting: Hematology & Oncology

## 2020-08-09 ENCOUNTER — Inpatient Hospital Stay: Payer: BC Managed Care – PPO

## 2020-08-12 ENCOUNTER — Ambulatory Visit
Admission: RE | Admit: 2020-08-12 | Discharge: 2020-08-12 | Disposition: A | Discharge status: Home / Self Care | Payer: BC Managed Care – PPO | Source: Ambulatory Visit | Attending: Orthopedic Surgery | Admitting: Orthopedic Surgery

## 2020-08-12 ENCOUNTER — Other Ambulatory Visit: Payer: Self-pay

## 2020-08-12 DIAGNOSIS — M25561 Pain in right knee: Secondary | ICD-10-CM

## 2020-08-12 DIAGNOSIS — S83411A Sprain of medial collateral ligament of right knee, initial encounter: Secondary | ICD-10-CM

## 2020-09-09 ENCOUNTER — Telehealth: Payer: Self-pay

## 2020-09-09 ENCOUNTER — Inpatient Hospital Stay: Payer: BC Managed Care – PPO | Attending: Hematology & Oncology | Admitting: Hematology & Oncology

## 2020-09-09 ENCOUNTER — Encounter: Payer: Self-pay | Admitting: Hematology & Oncology

## 2020-09-09 ENCOUNTER — Inpatient Hospital Stay: Payer: BC Managed Care – PPO

## 2020-09-09 ENCOUNTER — Other Ambulatory Visit: Payer: Self-pay

## 2020-09-09 VITALS — BP 110/68 | HR 75 | Temp 98.9°F | Resp 17 | Wt 172.0 lb

## 2020-09-09 DIAGNOSIS — D729 Disorder of white blood cells, unspecified: Secondary | ICD-10-CM

## 2020-09-09 DIAGNOSIS — D72819 Decreased white blood cell count, unspecified: Secondary | ICD-10-CM | POA: Diagnosis present

## 2020-09-09 LAB — CMP (CANCER CENTER ONLY)
ALT: 22 U/L (ref 0–44)
AST: 28 U/L (ref 15–41)
Albumin: 3.7 g/dL (ref 3.5–5.0)
Alkaline Phosphatase: 51 U/L (ref 38–126)
Anion gap: 5 (ref 5–15)
BUN: 13 mg/dL (ref 6–20)
CO2: 29 mmol/L (ref 22–32)
Calcium: 9.4 mg/dL (ref 8.9–10.3)
Chloride: 105 mmol/L (ref 98–111)
Creatinine: 0.73 mg/dL (ref 0.44–1.00)
GFR, Estimated: >60 mL/min (ref >60)
Glucose, Bld: 83 mg/dL (ref 70–99)
Potassium: 3.9 mmol/L (ref 3.5–5.1)
Sodium: 139 mmol/L (ref 135–145)
Total Bilirubin: 0.7 mg/dL (ref 0.3–1.2)
Total Protein: 7.2 g/dL (ref 6.5–8.1)

## 2020-09-09 LAB — CBC WITH DIFFERENTIAL (CANCER CENTER ONLY)
Abs Immature Granulocytes: 0 10*3/uL (ref 0.00–0.07)
Basophils Absolute: 0 10*3/uL (ref 0.0–0.1)
Basophils Relative: 1 %
Eosinophils Absolute: 0.1 10*3/uL (ref 0.0–0.5)
Eosinophils Relative: 2 %
HCT: 35.1 % — ABNORMAL LOW (ref 36.0–46.0)
Hemoglobin: 11.6 g/dL — ABNORMAL LOW (ref 12.0–15.0)
Immature Granulocytes: 0 %
Lymphocytes Relative: 73 %
Lymphs Abs: 3.2 10*3/uL (ref 0.7–4.0)
MCH: 28.1 pg (ref 26.0–34.0)
MCHC: 33 g/dL (ref 30.0–36.0)
MCV: 85 fL (ref 80.0–100.0)
Monocytes Absolute: 0.3 10*3/uL (ref 0.1–1.0)
Monocytes Relative: 6 %
Neutro Abs: 0.8 10*3/uL — ABNORMAL LOW (ref 1.7–7.7)
Neutrophils Relative %: 18 %
Platelet Count: 124 10*3/uL — ABNORMAL LOW (ref 150–400)
RBC: 4.13 MIL/uL (ref 3.87–5.11)
RDW: 13 % (ref 11.5–15.5)
WBC Count: 4.3 10*3/uL (ref 4.0–10.5)
nRBC: 0 % (ref 0.0–0.2)

## 2020-09-09 LAB — SAVE SMEAR(SSMR), FOR PROVIDER SLIDE REVIEW

## 2020-09-09 LAB — LACTATE DEHYDROGENASE: LDH: 167 U/L (ref 98–192)

## 2020-09-09 NOTE — Progress Notes (Signed)
Hematology and Oncology Follow Up Visit  Monique Richardson [id number] Oct 18, 1965 55 y.o. 09/09/2020   Principle Diagnosis:  Chronic leukopenia   Current Therapy:    Observation     Interim History:  Monique Richardson is back for follow-up.  Monique Richardson comes in with a knee brace on her right knee.  Back in late March, Monique Richardson is playing with her new dog and slipped and tore her MCL.  Monique Richardson has wear the brace for another couple weeks.  The real problem that Monique Richardson has had is with her poor son.  He is now hospitalized.  He is in a home.  He has schizophrenia.  He just is not getting any better.  I just feel bad for her.  Monique Richardson has had no problems with infections.  Monique Richardson has had no sinus issues.  Monique Richardson has had no cough or shortness of breath.  Monique Richardson has had no issues with nausea or vomiting.  There is been no change in bowel or bladder habits.  Currently, her performance status is ECOG 1.    Medications:  Current Outpatient Medications:  .  EPINEPHrine 0.3 mg/0.3 mL IJ SOAJ injection, Inject into the muscle., Disp: , Rfl:  .  fluticasone (FLONASE) 50 MCG/ACT nasal spray, Place 2 sprays into both nostrils daily., Disp: , Rfl:  .  montelukast (SINGULAIR) 10 MG tablet, Take 10 mg by mouth daily., Disp: , Rfl:   Allergies:  Allergies  Allergen Reactions  . Bee Venom Anaphylaxis    Past Medical History, Surgical history, Social history, and Family History were reviewed and updated.  Review of Systems: Review of Systems  Constitutional: Negative.   HENT: Negative.   Eyes: Negative.   Respiratory: Negative.   Cardiovascular: Negative.   Gastrointestinal: Negative.   Genitourinary: Negative.   Musculoskeletal: Negative.   Skin: Positive for rash.  Neurological: Negative.   Endo/Heme/Allergies: Negative.   Psychiatric/Behavioral: Negative.      Physical Exam:  weight is 172 lb (78 kg). Her temperature is 98.9 F (37.2 C). Her blood pressure is 110/68 and her pulse is 75. Her respiration is 17.   Wt  Readings from Last 3 Encounters:  09/09/20 172 lb (78 kg)  04/10/20 165 lb (74.8 kg)  02/19/20 167 lb (75.8 kg)     Physical Exam Vitals reviewed.  HENT:     Head: Normocephalic and atraumatic.  Eyes:     Pupils: Pupils are equal, round, and reactive to light.  Cardiovascular:     Rate and Rhythm: Normal rate and regular rhythm.     Heart sounds: Normal heart sounds.  Pulmonary:     Effort: Pulmonary effort is normal.     Breath sounds: Normal breath sounds.  Abdominal:     General: Bowel sounds are normal.     Palpations: Abdomen is soft.  Musculoskeletal:        General: No tenderness or deformity. Normal range of motion.     Cervical back: Normal range of motion.  Lymphadenopathy:     Cervical: No cervical adenopathy.  Skin:    General: Skin is warm and dry.     Findings: No erythema or rash.     Comments: Monique Richardson has a localized area of a vesicular type rash in the right lateral hip.  There is might be in the L1 dermatome.  Neurological:     Mental Status: Monique Richardson is alert and oriented to person, place, and time.  Psychiatric:        Behavior: Behavior normal.  Thought Content: Thought content normal.        Judgment: Judgment normal.       Chemistry      Component Value Date/Time   NA 139 09/09/2020 1510   NA 139 01/15/2017 1419   K 3.9 09/09/2020 1510   K 3.9 01/15/2017 1419   CL 105 09/09/2020 1510   CL 104 01/15/2017 1419   CO2 29 09/09/2020 1510   CO2 26 01/15/2017 1419   BUN 13 09/09/2020 1510   BUN 16 01/15/2017 1419   CREATININE 0.73 09/09/2020 1510   CREATININE 0.81 01/15/2017 1419      Component Value Date/Time   CALCIUM 9.4 09/09/2020 1510   CALCIUM 9.8 01/15/2017 1419   ALKPHOS 51 09/09/2020 1510   ALKPHOS 56 01/15/2017 1419   AST 28 09/09/2020 1510   ALT 22 09/09/2020 1510   BILITOT 0.7 09/09/2020 1510      Impression and Plan: Ms. Cannell is a 55 year old white female. Monique Richardson has chronic leukopenia. [name] for several  years.   Again, I just feel bad that her son is not doing well.  I just hope that he will be able to get some help.  I will see any problems with her blood counts affecting the recovery from her knee injury.  Monique Richardson is still leukopenic but I do think this is going to be a problem with healing.  Overall, her blood smear looks pretty much the same.  I do not see any immature myeloid or lymphoid cells.  There are no blasts.  We will plan to get her through the entire summer now.  I will plan to get her back in October.   Volanda Napoleon, MD 5/2/20223:52 PM

## 2020-09-09 NOTE — Telephone Encounter (Signed)
appts made per Dr Filomena Jungling los and pt req to view on my chart  Monique Richardson

## 2021-02-10 ENCOUNTER — Inpatient Hospital Stay: Payer: BC Managed Care – PPO

## 2021-02-10 ENCOUNTER — Inpatient Hospital Stay: Payer: BC Managed Care – PPO | Admitting: Hematology & Oncology

## 2021-02-25 ENCOUNTER — Encounter: Payer: Self-pay | Admitting: Hematology & Oncology

## 2021-02-25 ENCOUNTER — Inpatient Hospital Stay: Payer: BC Managed Care – PPO | Admitting: Hematology & Oncology

## 2021-02-25 ENCOUNTER — Other Ambulatory Visit: Payer: Self-pay

## 2021-02-25 ENCOUNTER — Inpatient Hospital Stay: Payer: BC Managed Care – PPO | Attending: Hematology & Oncology

## 2021-02-25 VITALS — BP 120/73 | HR 68 | Temp 98.3°F | Resp 18 | Wt 177.0 lb

## 2021-02-25 DIAGNOSIS — D72819 Decreased white blood cell count, unspecified: Secondary | ICD-10-CM | POA: Insufficient documentation

## 2021-02-25 DIAGNOSIS — Z79899 Other long term (current) drug therapy: Secondary | ICD-10-CM | POA: Diagnosis not present

## 2021-02-25 DIAGNOSIS — D729 Disorder of white blood cells, unspecified: Secondary | ICD-10-CM

## 2021-02-25 DIAGNOSIS — Z91030 Bee allergy status: Secondary | ICD-10-CM | POA: Diagnosis not present

## 2021-02-25 DIAGNOSIS — D469 Myelodysplastic syndrome, unspecified: Secondary | ICD-10-CM | POA: Insufficient documentation

## 2021-02-25 DIAGNOSIS — F209 Schizophrenia, unspecified: Secondary | ICD-10-CM | POA: Insufficient documentation

## 2021-02-25 DIAGNOSIS — R21 Rash and other nonspecific skin eruption: Secondary | ICD-10-CM | POA: Diagnosis not present

## 2021-02-25 LAB — CBC WITH DIFFERENTIAL (CANCER CENTER ONLY)
Abs Immature Granulocytes: 0 10*3/uL (ref 0.00–0.07)
Basophils Absolute: 0 10*3/uL (ref 0.0–0.1)
Basophils Relative: 1 %
Eosinophils Absolute: 0.1 10*3/uL (ref 0.0–0.5)
Eosinophils Relative: 1 %
HCT: 37.2 % (ref 36.0–46.0)
Hemoglobin: 12.2 g/dL (ref 12.0–15.0)
Immature Granulocytes: 0 %
Lymphocytes Relative: 78 %
Lymphs Abs: 3.1 10*3/uL (ref 0.7–4.0)
MCH: 28.2 pg (ref 26.0–34.0)
MCHC: 32.8 g/dL (ref 30.0–36.0)
MCV: 85.9 fL (ref 80.0–100.0)
Monocytes Absolute: 0.2 10*3/uL (ref 0.1–1.0)
Monocytes Relative: 6 %
Neutro Abs: 0.6 10*3/uL — ABNORMAL LOW (ref 1.7–7.7)
Neutrophils Relative %: 14 %
Platelet Count: 128 10*3/uL — ABNORMAL LOW (ref 150–400)
RBC: 4.33 MIL/uL (ref 3.87–5.11)
RDW: 12.9 % (ref 11.5–15.5)
WBC Count: 4 10*3/uL (ref 4.0–10.5)
nRBC: 0 % (ref 0.0–0.2)

## 2021-02-25 LAB — CMP (CANCER CENTER ONLY)
ALT: 25 U/L (ref 0–44)
AST: 30 U/L (ref 15–41)
Albumin: 3.8 g/dL (ref 3.5–5.0)
Alkaline Phosphatase: 48 U/L (ref 38–126)
Anion gap: 5 (ref 5–15)
BUN: 16 mg/dL (ref 6–20)
CO2: 30 mmol/L (ref 22–32)
Calcium: 9.5 mg/dL (ref 8.9–10.3)
Chloride: 105 mmol/L (ref 98–111)
Creatinine: 0.68 mg/dL (ref 0.44–1.00)
GFR, Estimated: >60 mL/min (ref >60)
Glucose, Bld: 80 mg/dL (ref 70–99)
Potassium: 4 mmol/L (ref 3.5–5.1)
Sodium: 140 mmol/L (ref 135–145)
Total Bilirubin: 0.8 mg/dL (ref 0.3–1.2)
Total Protein: 7.5 g/dL (ref 6.5–8.1)

## 2021-02-25 LAB — LACTATE DEHYDROGENASE: LDH: 175 U/L (ref 98–192)

## 2021-02-25 LAB — SAVE SMEAR(SSMR), FOR PROVIDER SLIDE REVIEW

## 2021-02-25 NOTE — Progress Notes (Signed)
Hematology and Oncology Follow Up Visit  Monique Richardson [id number] 08-02-1965 55 y.o. 02/25/2021   Principle Diagnosis:  Chronic leukopenia   Current Therapy:   Observation     Interim History:  Ms. Bohlen is back for follow-up.  She is in been doing pretty well right now.  The knee brace is off her right knee.  She says it is little stiff.  She is trying to work it out.  She is gained a little bit of weight.  She has some company over.  She is still worried about her son.  He is in a group home.  He has schizophrenia.  She is not sure he is taking his medications.  She is had no problems with fever.  She has had no issues with nausea or vomiting.  There is been no change in bowel or bladder habits.  She is going through the change of life right now.  She has had no rashes.  She has had no headache.  Her appetite has been pretty good.  She is try to watch what she eats so she can lose a little bit of weight.  Overall, I would say her performance status is probably ECOG 1.      Medications:  Current Outpatient Medications:    EPINEPHrine 0.3 mg/0.3 mL IJ SOAJ injection, Inject into the muscle., Disp: , Rfl:    fluticasone (FLONASE) 50 MCG/ACT nasal spray, Place 2 sprays into both nostrils daily. As needed, Disp: , Rfl:    montelukast (SINGULAIR) 10 MG tablet, Take 10 mg by mouth daily. As needed, Disp: , Rfl:   Allergies:  Allergies  Allergen Reactions   Bee Venom Anaphylaxis    Past Medical History, Surgical history, Social history, and Family History were reviewed and updated.  Review of Systems: Review of Systems  Constitutional: Negative.   HENT: Negative.    Eyes: Negative.   Respiratory: Negative.    Cardiovascular: Negative.   Gastrointestinal: Negative.   Genitourinary: Negative.   Musculoskeletal: Negative.   Skin:  Positive for rash.  Neurological: Negative.   Endo/Heme/Allergies: Negative.   Psychiatric/Behavioral: Negative.      Physical  Exam:  weight is 177 lb (80.3 kg). Her oral temperature is 98.3 F (36.8 C). Her blood pressure is 120/73 and her pulse is 68. Her respiration is 18 and oxygen saturation is 100%.   Wt Readings from Last 3 Encounters:  02/25/21 177 lb (80.3 kg)  09/09/20 172 lb (78 kg)  04/10/20 165 lb (74.8 kg)     Physical Exam Vitals reviewed.  HENT:     Head: Normocephalic and atraumatic.  Eyes:     Pupils: Pupils are equal, round, and reactive to light.  Cardiovascular:     Rate and Rhythm: Normal rate and regular rhythm.     Heart sounds: Normal heart sounds.  Pulmonary:     Effort: Pulmonary effort is normal.     Breath sounds: Normal breath sounds.  Abdominal:     General: Bowel sounds are normal.     Palpations: Abdomen is soft.  Musculoskeletal:        General: No tenderness or deformity. Normal range of motion.     Cervical back: Normal range of motion.  Lymphadenopathy:     Cervical: No cervical adenopathy.  Skin:    General: Skin is warm and dry.     Findings: No erythema or rash.  Neurological:     Mental Status: She is alert and oriented to person,  place, and time.  Psychiatric:        Behavior: Behavior normal.        Thought Content: Thought content normal.        Judgment: Judgment normal.      Chemistry      Component Value Date/Time   NA 140 02/25/2021 1449   NA 139 01/15/2017 1419   K 4.0 02/25/2021 1449   K 3.9 01/15/2017 1419   CL 105 02/25/2021 1449   CL 104 01/15/2017 1419   CO2 30 02/25/2021 1449   CO2 26 01/15/2017 1419   BUN 16 02/25/2021 1449   BUN 16 01/15/2017 1419   CREATININE 0.68 02/25/2021 1449   CREATININE 0.81 01/15/2017 1419      Component Value Date/Time   CALCIUM 9.5 02/25/2021 1449   CALCIUM 9.8 01/15/2017 1419   ALKPHOS 48 02/25/2021 1449   ALKPHOS 56 01/15/2017 1419   AST 30 02/25/2021 1449   ALT 25 02/25/2021 1449   BILITOT 0.8 02/25/2021 1449      Impression and Plan: Ms. Lesmeister is a 55 year old white female. She has  chronic leukopenia. [name] for several years.   So far, everything is still is holding pretty stable.  I do think that it would be worthwhile to get a NGS panel for myelodysplasia.  I suppose that she could certainly have one of the precursors for myelodysplasia.  I think the NGS would be helpful for [place].  Again, I just feel bad that her son is not doing well.  I just hope that he will be able to get some help.  Overall, her blood smear looks pretty much the same.  I do not see any immature myeloid or lymphoid cells.  There are no blasts.  We will still plan to get her back in 6 months.  We can we will see her back sooner if she has any problems.   Volanda Napoleon, MD 10/18/20223:28 PM

## 2021-08-27 ENCOUNTER — Inpatient Hospital Stay: Payer: BC Managed Care – PPO | Attending: Hematology & Oncology

## 2021-08-27 ENCOUNTER — Telehealth: Payer: Self-pay | Admitting: *Deleted

## 2021-08-27 ENCOUNTER — Inpatient Hospital Stay: Payer: BC Managed Care – PPO | Admitting: Hematology & Oncology

## 2021-08-27 ENCOUNTER — Encounter: Payer: Self-pay | Admitting: Hematology & Oncology

## 2021-08-27 VITALS — BP 117/70 | HR 76 | Temp 98.2°F | Resp 16 | Ht 68.11 in | Wt 170.1 lb

## 2021-08-27 DIAGNOSIS — R21 Rash and other nonspecific skin eruption: Secondary | ICD-10-CM | POA: Diagnosis not present

## 2021-08-27 DIAGNOSIS — D729 Disorder of white blood cells, unspecified: Secondary | ICD-10-CM | POA: Diagnosis not present

## 2021-08-27 DIAGNOSIS — Z91030 Bee allergy status: Secondary | ICD-10-CM | POA: Diagnosis not present

## 2021-08-27 DIAGNOSIS — Z79899 Other long term (current) drug therapy: Secondary | ICD-10-CM | POA: Insufficient documentation

## 2021-08-27 DIAGNOSIS — D72819 Decreased white blood cell count, unspecified: Secondary | ICD-10-CM | POA: Insufficient documentation

## 2021-08-27 LAB — CBC WITH DIFFERENTIAL (CANCER CENTER ONLY)
Abs Immature Granulocytes: 0 10*3/uL (ref 0.00–0.07)
Basophils Absolute: 0 10*3/uL (ref 0.0–0.1)
Basophils Relative: 1 %
Eosinophils Absolute: 0.1 10*3/uL (ref 0.0–0.5)
Eosinophils Relative: 2 %
HCT: 37.4 % (ref 36.0–46.0)
Hemoglobin: 12.3 g/dL (ref 12.0–15.0)
Immature Granulocytes: 0 %
Lymphocytes Relative: 73 %
Lymphs Abs: 3.6 10*3/uL (ref 0.7–4.0)
MCH: 27.9 pg (ref 26.0–34.0)
MCHC: 32.9 g/dL (ref 30.0–36.0)
MCV: 84.8 fL (ref 80.0–100.0)
Monocytes Absolute: 0.3 10*3/uL (ref 0.1–1.0)
Monocytes Relative: 6 %
Neutro Abs: 0.9 10*3/uL — ABNORMAL LOW (ref 1.7–7.7)
Neutrophils Relative %: 18 %
Platelet Count: 117 10*3/uL — ABNORMAL LOW (ref 150–400)
RBC: 4.41 MIL/uL (ref 3.87–5.11)
RDW: 12.7 % (ref 11.5–15.5)
Smear Review: NORMAL
WBC Count: 4.8 10*3/uL (ref 4.0–10.5)
nRBC: 0 % (ref 0.0–0.2)

## 2021-08-27 LAB — CMP (CANCER CENTER ONLY)
ALT: 17 U/L (ref 0–44)
AST: 25 U/L (ref 15–41)
Albumin: 4 g/dL (ref 3.5–5.0)
Alkaline Phosphatase: 49 U/L (ref 38–126)
Anion gap: 5 (ref 5–15)
BUN: 16 mg/dL (ref 6–20)
CO2: 29 mmol/L (ref 22–32)
Calcium: 9.6 mg/dL (ref 8.9–10.3)
Chloride: 104 mmol/L (ref 98–111)
Creatinine: 0.79 mg/dL (ref 0.44–1.00)
GFR, Estimated: >60 mL/min (ref >60)
Glucose, Bld: 80 mg/dL (ref 70–99)
Potassium: 3.9 mmol/L (ref 3.5–5.1)
Sodium: 138 mmol/L (ref 135–145)
Total Bilirubin: 0.8 mg/dL (ref 0.3–1.2)
Total Protein: 7.9 g/dL (ref 6.5–8.1)

## 2021-08-27 LAB — LACTATE DEHYDROGENASE: LDH: 173 U/L (ref 98–192)

## 2021-08-27 LAB — SAVE SMEAR(SSMR), FOR PROVIDER SLIDE REVIEW

## 2021-08-27 NOTE — Progress Notes (Signed)
?Hematology and Oncology Follow Up Visit ? ?Monique Richardson ?[id number] ?Jul 19, 1965 56 y.o. ?08/27/2021 ? ? ?Principle Diagnosis:  Chronic leukopenia ? ? ?Current Therapy:   ?Observation ?    ?Interim History:  Monique Richardson is back for follow-up.  Unfortunately, her son passed away 2 months ago.  He was having issues with schizophrenia.  I know this is quite difficult for her.  However, she has some consolation knowing that he is out of his emotional torture. ? ?She has had no problem with infections.  We last saw her 6 months ago.  She has had no issues with rashes.  There is been no fever.  She has had no nausea or vomiting.  There is been no change in bowel or bladder habits.  She has had no headache. ? ?She says a lot of her neck pain resolved after her son passed away. ? ?Currently, I would say her performance status is ECOG 0.   ? ? ? Medications:  ?Current Outpatient Medications:  ?  EPINEPHrine 0.3 mg/0.3 mL IJ SOAJ injection, Inject into the muscle., Disp: , Rfl:  ?  fluticasone (FLONASE) 50 MCG/ACT nasal spray, Place 2 sprays into both nostrils daily. As needed, Disp: , Rfl:  ?  montelukast (SINGULAIR) 10 MG tablet, Take 10 mg by mouth daily. As needed, Disp: , Rfl:  ? ?Allergies:  ?Allergies  ?Allergen Reactions  ? Bee Venom Anaphylaxis  ? ? ?Past Medical History, Surgical history, Social history, and Family History were reviewed and updated. ? ?Review of Systems: ?Review of Systems  ?Constitutional: Negative.   ?HENT: Negative.    ?Eyes: Negative.   ?Respiratory: Negative.    ?Cardiovascular: Negative.   ?Gastrointestinal: Negative.   ?Genitourinary: Negative.   ?Musculoskeletal: Negative.   ?Skin:  Positive for rash.  ?Neurological: Negative.   ?Endo/Heme/Allergies: Negative.   ?Psychiatric/Behavioral: Negative.    ? ? ?Physical Exam: ? height is 5' 8.11" (1.73 m) and weight is 170 lb 1.9 oz (77.2 kg). Her oral temperature is 98.2 ?F (36.8 ?C). Her blood pressure is 117/70 and her pulse is 76. Her  respiration is 16 and oxygen saturation is 100%.  ? ?Wt Readings from Last 3 Encounters:  ?08/27/21 170 lb 1.9 oz (77.2 kg)  ?02/25/21 177 lb (80.3 kg)  ?09/09/20 172 lb (78 kg)  ? ? ? ?Physical Exam ?Vitals reviewed.  ?HENT:  ?   Head: Normocephalic and atraumatic.  ?Eyes:  ?   Pupils: Pupils are equal, round, and reactive to light.  ?Cardiovascular:  ?   Rate and Rhythm: Normal rate and regular rhythm.  ?   Heart sounds: Normal heart sounds.  ?Pulmonary:  ?   Effort: Pulmonary effort is normal.  ?   Breath sounds: Normal breath sounds.  ?Abdominal:  ?   General: Bowel sounds are normal.  ?   Palpations: Abdomen is soft.  ?Musculoskeletal:     ?   General: No tenderness or deformity. Normal range of motion.  ?   Cervical back: Normal range of motion.  ?Lymphadenopathy:  ?   Cervical: No cervical adenopathy.  ?Skin: ?   General: Skin is warm and dry.  ?   Findings: No erythema or rash.  ?Neurological:  ?   Mental Status: She is alert and oriented to person, place, and time.  ?Psychiatric:     ?   Behavior: Behavior normal.     ?   Thought Content: Thought content normal.     ?   Judgment:  Judgment normal.  ? ? ?  Chemistry   ?   ?Component Value Date/Time  ? NA 138 08/27/2021 1450  ? NA 139 01/15/2017 1419  ? K 3.9 08/27/2021 1450  ? K 3.9 01/15/2017 1419  ? CL 104 08/27/2021 1450  ? CL 104 01/15/2017 1419  ? CO2 29 08/27/2021 1450  ? CO2 26 01/15/2017 1419  ? BUN 16 08/27/2021 1450  ? BUN 16 01/15/2017 1419  ? CREATININE 0.79 08/27/2021 1450  ? CREATININE 0.81 01/15/2017 1419  ?    ?Component Value Date/Time  ? CALCIUM 9.6 08/27/2021 1450  ? CALCIUM 9.8 01/15/2017 1419  ? ALKPHOS 49 08/27/2021 1450  ? ALKPHOS 56 01/15/2017 1419  ? AST 25 08/27/2021 1450  ? ALT 17 08/27/2021 1450  ? BILITOT 0.8 08/27/2021 1450  ?  ? ? ?Impression and Plan: ?Monique Richardson is a 56 year old white female. She has chronic leukopenia. [name] for several years.  ? ?So far, everything is still is holding pretty stable.  I will  get a blood under the microscope.  I do not see anything that looks suspicious.  However, I suspect that there might be an element of myelodysplasia or "pre-" myelodysplasia. ? ?I am sending off the NGS panel.  It would not surprise me if she has some genetic abnormalities. ? ?She is totally asymptomatic. ? ?We will still plan for follow-up in 6 months. ? ?Volanda Napoleon, MD ?4/19/20233:46 PM ?

## 2021-08-27 NOTE — Telephone Encounter (Signed)
Per 08/27/21 los - gave upcoming appointments - confirmed ?

## 2021-09-04 ENCOUNTER — Telehealth: Payer: Self-pay

## 2021-09-04 LAB — MYELODYSPLASTIC SYNDROME (MDS) PANEL

## 2021-09-04 NOTE — Telephone Encounter (Signed)
-----   Message from Volanda Napoleon, MD sent at 09/04/2021  9:52 AM EDT ----- ?Call and let her know that the genetic test that we ran are all normal.  This is a very good sign that her bone marrow should not turn into leukemia. ?

## 2021-09-04 NOTE — Telephone Encounter (Signed)
Advised via MyChart.

## 2021-10-21 ENCOUNTER — Encounter: Payer: Self-pay | Admitting: Psychologist

## 2021-10-21 ENCOUNTER — Ambulatory Visit: Payer: BC Managed Care – PPO | Admitting: Psychologist

## 2021-10-21 DIAGNOSIS — F4321 Adjustment disorder with depressed mood: Secondary | ICD-10-CM | POA: Diagnosis not present

## 2021-10-21 NOTE — Progress Notes (Signed)
  [facility] [address], [address] [place] [address] Dept: [phone] Dept Fax: [phone] Loc: [phone] Loc Fax: [phone]  Psychology Therapy Session Progress Note  Patient ID: Monique Richardson, female  DOB: 11-Feb-1966, 56 y.o.  MRN: [id number]  10/21/2021 Start time: 8:55 AM End time: 9:50 AM  Session #: In office psychotherapy session  Present: patient  Service provided: 88416S Individual Psychotherapy (45 min.)  Current Concerns: Complicated grief.  Her 89 year old son, Gilmer Mor, a former patient at this clinic (he was last seen in 2016) was killed by a drunk driver in February 0630.  He had a complex psychiatric history including diagnoses of mutism, ADHD, and OCD as a child and young adult.  Gradually has symptoms segued into schizophrenia with multiple subsequent hospitalizations.  He was living in a group home at the time of his death.  [name] wanted to process her grief and vent her frustrations regarding the lack of meaningful follow-up care for her son post hospitalizations beginning in 2019.  Current Symptoms: Sadness and grief, anger, family stress  Mental Status: Appearance: Well Groomed Attention: good  Motor Behavior: Normal Affect: Tearful Mood: sad Thought Process: normal Thought Content: normal Suicidal Ideation: None Homicidal Ideation:None Orientation: time, place, and person Insight: Intact Judgement: Good  Diagnosis: Complicated grief  Long Term Treatment Goals: Referred to grief specialist, given the name of 4 providers  Anticipated Frequency of Visits: As needed Anticipated Length of Treatment Episode: As needed  Treatment Intervention: Supportive therapy  Response to Treatment: Neutral  Medical Necessity: Assisted patient to achieve or maintain maximum functional capacity  Plan: Grief counseling, given the  name of for provider specializing and the treatment of grief/complicated grief  [name] 10/21/2021

## 2021-11-26 ENCOUNTER — Telehealth: Payer: Self-pay | Admitting: *Deleted

## 2021-11-26 NOTE — Telephone Encounter (Signed)
Received documents from [facility] requesting medical records - sent documents to [facility] through intra-office mail.

## 2021-12-19 ENCOUNTER — Telehealth: Payer: Self-pay | Admitting: Hematology & Oncology

## 2021-12-19 ENCOUNTER — Other Ambulatory Visit: Payer: Self-pay | Admitting: Family

## 2021-12-19 NOTE — Telephone Encounter (Signed)
Pt called in to ask about getting an appeal on the 4/19 [employer] MDS test order by Dr. Marin Olp. Insurance denied coverage for the test and let her know that she could submit an appeal. Confirmed pt's contact information in system.  Informed desk nurse, Lorriane Shire, about situation and request for appeal.

## 2022-02-26 ENCOUNTER — Inpatient Hospital Stay: Payer: BC Managed Care – PPO | Admitting: Hematology & Oncology

## 2022-02-26 ENCOUNTER — Encounter: Payer: Self-pay | Admitting: Hematology & Oncology

## 2022-02-26 ENCOUNTER — Other Ambulatory Visit: Payer: Self-pay

## 2022-02-26 ENCOUNTER — Inpatient Hospital Stay: Payer: BC Managed Care – PPO | Attending: Hematology & Oncology

## 2022-02-26 VITALS — BP 129/68 | HR 69 | Temp 98.2°F | Resp 18 | Ht 69.0 in | Wt 180.0 lb

## 2022-02-26 DIAGNOSIS — D729 Disorder of white blood cells, unspecified: Secondary | ICD-10-CM

## 2022-02-26 DIAGNOSIS — Z79899 Other long term (current) drug therapy: Secondary | ICD-10-CM | POA: Insufficient documentation

## 2022-02-26 DIAGNOSIS — D72819 Decreased white blood cell count, unspecified: Secondary | ICD-10-CM | POA: Diagnosis present

## 2022-02-26 LAB — CMP (CANCER CENTER ONLY)
ALT: 16 U/L (ref 0–44)
AST: 24 U/L (ref 15–41)
Albumin: 4 g/dL (ref 3.5–5.0)
Alkaline Phosphatase: 54 U/L (ref 38–126)
Anion gap: 5 (ref 5–15)
BUN: 15 mg/dL (ref 6–20)
CO2: 29 mmol/L (ref 22–32)
Calcium: 9.6 mg/dL (ref 8.9–10.3)
Chloride: 105 mmol/L (ref 98–111)
Creatinine: 0.8 mg/dL (ref 0.44–1.00)
GFR, Estimated: >60 mL/min (ref >60)
Glucose, Bld: 98 mg/dL (ref 70–99)
Potassium: 4.3 mmol/L (ref 3.5–5.1)
Sodium: 139 mmol/L (ref 135–145)
Total Bilirubin: 0.7 mg/dL (ref 0.3–1.2)
Total Protein: 7.7 g/dL (ref 6.5–8.1)

## 2022-02-26 LAB — CBC WITH DIFFERENTIAL (CANCER CENTER ONLY)
Abs Immature Granulocytes: 0 10*3/uL (ref 0.00–0.07)
Basophils Absolute: 0 10*3/uL (ref 0.0–0.1)
Basophils Relative: 1 %
Eosinophils Absolute: 0.1 10*3/uL (ref 0.0–0.5)
Eosinophils Relative: 3 %
HCT: 38.9 % (ref 36.0–46.0)
Hemoglobin: 12.9 g/dL (ref 12.0–15.0)
Immature Granulocytes: 0 %
Lymphocytes Relative: 76 %
Lymphs Abs: 2.7 10*3/uL (ref 0.7–4.0)
MCH: 28 pg (ref 26.0–34.0)
MCHC: 33.2 g/dL (ref 30.0–36.0)
MCV: 84.6 fL (ref 80.0–100.0)
Monocytes Absolute: 0.3 10*3/uL (ref 0.1–1.0)
Monocytes Relative: 7 %
Neutro Abs: 0.5 10*3/uL — ABNORMAL LOW (ref 1.7–7.7)
Neutrophils Relative %: 13 %
Platelet Count: 146 10*3/uL — ABNORMAL LOW (ref 150–400)
RBC: 4.6 MIL/uL (ref 3.87–5.11)
RDW: 12.6 % (ref 11.5–15.5)
Smear Review: NORMAL
WBC Count: 3.5 10*3/uL — ABNORMAL LOW (ref 4.0–10.5)
nRBC: 0 % (ref 0.0–0.2)

## 2022-02-26 LAB — LACTATE DEHYDROGENASE: LDH: 171 U/L (ref 98–192)

## 2022-02-26 LAB — SAVE SMEAR(SSMR), FOR PROVIDER SLIDE REVIEW

## 2022-02-26 NOTE — Progress Notes (Signed)
Hematology and Oncology Follow Up Visit  Monique Richardson [id number] [birth year] 56 y.o. 02/26/2022   Principle Diagnosis:  Chronic leukopenia --  NGS panel is (-)   Current Therapy:   Observation     Interim History:  Monique Richardson is back for follow-up.  We last saw Monique back in April.  She still is recovering from Monique Richardson passing away.  I think he passed away in 06-29-2022.  She now has to deal with Monique Richardson who is having marital problems.  I must say that she is holding herself together quite well.  She is yet to have any infections.  She has had no problems with fever.  She has had no issues with COVID.  There has been no change in bowel or bladder habits.  She has had no rashes.  She has had no bleeding.  She has had no cough or shortness of breath.  Overall, I would say that Monique performance status is probably ECOG 0.        Medications:  Current Outpatient Medications:    EPINEPHrine 0.3 mg/0.3 mL IJ SOAJ injection, Inject into the muscle., Disp: , Rfl:    famciclovir (FAMVIR) 500 MG tablet, Take 500 mg by mouth., Disp: , Rfl:   Allergies:  Allergies  Allergen Reactions   Bee Venom Anaphylaxis    Past Medical History, Surgical history, Social history, and Family History were reviewed and updated.  Review of Systems: Review of Systems  Constitutional: Negative.   HENT: Negative.    Eyes: Negative.   Respiratory: Negative.    Cardiovascular: Negative.   Gastrointestinal: Negative.   Genitourinary: Negative.   Musculoskeletal: Negative.   Skin:  Positive for rash.  Neurological: Negative.   Endo/Heme/Allergies: Negative.   Psychiatric/Behavioral: Negative.       Physical Exam:  height is '5\' 9"'$  (1.753 m) and weight is 180 lb (81.6 kg). Monique oral temperature is 98.2 F (36.8 C). Monique blood pressure is 129/68 and Monique pulse is 69. Monique respiration is 18 and oxygen saturation is 100%.   Wt Readings from Last 3 Encounters:  02/26/22 180 lb (81.6 kg)  08/27/21 170 lb  1.9 oz (77.2 kg)  02/25/21 177 lb (80.3 kg)     Physical Exam Vitals reviewed.  HENT:     Head: Normocephalic and atraumatic.  Eyes:     Pupils: Pupils are equal, round, and reactive to light.  Cardiovascular:     Rate and Rhythm: Normal rate and regular rhythm.     Heart sounds: Normal heart sounds.  Pulmonary:     Effort: Pulmonary effort is normal.     Breath sounds: Normal breath sounds.  Abdominal:     General: Bowel sounds are normal.     Palpations: Abdomen is soft.  Musculoskeletal:        General: No tenderness or deformity. Normal range of motion.     Cervical back: Normal range of motion.  Lymphadenopathy:     Cervical: No cervical adenopathy.  Skin:    General: Skin is warm and dry.     Findings: No erythema or rash.  Neurological:     Mental Status: She is alert and oriented to person, place, and time.  Psychiatric:        Behavior: Behavior normal.        Thought Content: Thought content normal.        Judgment: Judgment normal.       Chemistry      Component Value  Date/Time   NA 139 02/26/2022 1004   NA 139 01/15/2017 1419   K 4.3 02/26/2022 1004   K 3.9 01/15/2017 1419   CL 105 02/26/2022 1004   CL 104 01/15/2017 1419   CO2 29 02/26/2022 1004   CO2 26 01/15/2017 1419   BUN 15 02/26/2022 1004   BUN 16 01/15/2017 1419   CREATININE 0.80 02/26/2022 1004   CREATININE 0.81 01/15/2017 1419      Component Value Date/Time   CALCIUM 9.6 02/26/2022 1004   CALCIUM 9.8 01/15/2017 1419   ALKPHOS 54 02/26/2022 1004   ALKPHOS 56 01/15/2017 1419   AST 24 02/26/2022 1004   ALT 16 02/26/2022 1004   BILITOT 0.7 02/26/2022 1004      Impression and Plan: Monique Richardson is a 56 year old white female. She has chronic leukopenia. I been following Monique for several years.  I did look at Monique blood smear.  Again, there is nothing on the blood smear that looks suspicious.  Again I suspect that she may have some element of myelodysplasia, even though the NGS panel that  was sent off on Monique last time was negative for any mutations.  Again she has been totally asymptomatic.  I think we can still just follow Monique along.  I would like to get Monique back in 6 months for follow-up.  Volanda Napoleon, MD 10/19/202311:06 AM

## 2022-08-27 ENCOUNTER — Ambulatory Visit: Payer: BC Managed Care – PPO | Admitting: Hematology & Oncology

## 2022-08-27 ENCOUNTER — Other Ambulatory Visit: Payer: BC Managed Care – PPO

## 2022-09-02 ENCOUNTER — Inpatient Hospital Stay: Payer: BC Managed Care – PPO | Attending: Hematology & Oncology

## 2022-09-02 ENCOUNTER — Encounter: Payer: Self-pay | Admitting: Hematology & Oncology

## 2022-09-02 ENCOUNTER — Inpatient Hospital Stay: Payer: BC Managed Care – PPO | Admitting: Hematology & Oncology

## 2022-09-02 ENCOUNTER — Telehealth: Payer: Self-pay | Admitting: *Deleted

## 2022-09-02 VITALS — BP 116/74 | HR 61 | Temp 99.0°F | Resp 20 | Ht 69.0 in | Wt 178.1 lb

## 2022-09-02 DIAGNOSIS — D729 Disorder of white blood cells, unspecified: Secondary | ICD-10-CM | POA: Diagnosis not present

## 2022-09-02 DIAGNOSIS — D72819 Decreased white blood cell count, unspecified: Secondary | ICD-10-CM | POA: Diagnosis present

## 2022-09-02 LAB — CBC WITH DIFFERENTIAL (CANCER CENTER ONLY)
Abs Immature Granulocytes: 0 10*3/uL (ref 0.00–0.07)
Basophils Absolute: 0 10*3/uL (ref 0.0–0.1)
Basophils Relative: 1 %
Eosinophils Absolute: 0.1 10*3/uL (ref 0.0–0.5)
Eosinophils Relative: 3 %
HCT: 39.1 % (ref 36.0–46.0)
Hemoglobin: 13 g/dL (ref 12.0–15.0)
Immature Granulocytes: 0 %
Lymphocytes Relative: 76 %
Lymphs Abs: 2.4 10*3/uL (ref 0.7–4.0)
MCH: 28 pg (ref 26.0–34.0)
MCHC: 33.2 g/dL (ref 30.0–36.0)
MCV: 84.3 fL (ref 80.0–100.0)
Monocytes Absolute: 0.3 10*3/uL (ref 0.1–1.0)
Monocytes Relative: 9 %
Neutro Abs: 0.4 10*3/uL — CL (ref 1.7–7.7)
Neutrophils Relative %: 11 %
Platelet Count: 127 10*3/uL — ABNORMAL LOW (ref 150–400)
RBC: 4.64 MIL/uL (ref 3.87–5.11)
RDW: 12.9 % (ref 11.5–15.5)
Smear Review: NORMAL
WBC Count: 3.1 10*3/uL — ABNORMAL LOW (ref 4.0–10.5)
nRBC: 0 % (ref 0.0–0.2)

## 2022-09-02 LAB — CMP (CANCER CENTER ONLY)
ALT: 17 U/L (ref 0–44)
AST: 27 U/L (ref 15–41)
Albumin: 3.8 g/dL (ref 3.5–5.0)
Alkaline Phosphatase: 46 U/L (ref 38–126)
Anion gap: 7 (ref 5–15)
BUN: 15 mg/dL (ref 6–20)
CO2: 28 mmol/L (ref 22–32)
Calcium: 9.2 mg/dL (ref 8.9–10.3)
Chloride: 107 mmol/L (ref 98–111)
Creatinine: 0.77 mg/dL (ref 0.44–1.00)
GFR, Estimated: >60 mL/min (ref >60)
Glucose, Bld: 92 mg/dL (ref 70–99)
Potassium: 4.5 mmol/L (ref 3.5–5.1)
Sodium: 142 mmol/L (ref 135–145)
Total Bilirubin: 0.7 mg/dL (ref 0.3–1.2)
Total Protein: 7.4 g/dL (ref 6.5–8.1)

## 2022-09-02 LAB — LACTATE DEHYDROGENASE: LDH: 167 U/L (ref 98–192)

## 2022-09-02 LAB — SAVE SMEAR(SSMR), FOR PROVIDER SLIDE REVIEW

## 2022-09-02 NOTE — Telephone Encounter (Signed)
Dr. Ennever notified of ANC-0.4.  No new orders received at this time.  

## 2022-09-02 NOTE — Progress Notes (Signed)
Hematology and Oncology Follow Up Visit  Monique Richardson [id number] 1966/04/19 57 y.o. 09/02/2022   Principle Diagnosis:  Chronic leukopenia --  NGS panel is (-)   Current Therapy:   Observation     Interim History:  Ms. Thier is back for follow-up.  We see her every 6 months.  She is doing okay.  She still is getting over her son's passing.  He passed away I think back in 07/01/2022 of last year.  I know this has been very tough on her family.  I know that the Holiday season was very reflectful for them.  She has had no problem with infections.  She has had no fever.  She has had no probably COVID.  There is been no change in bowel or bladder habits.  She has had no rashes.  She has had no swollen lymph nodes.  She is trying to lose a little bit of weight.  I am sure that she will exercise little bit more with the warmer weather.  Currently, I would say performance status is probably ECOG 0.   Medications:  Current Outpatient Medications:    Cholecalciferol (VITAMIN D) 50 MCG (2000 UT) CAPS, Take 4,000 Units by mouth daily., Disp: , Rfl:    Turmeric (QC TUMERIC COMPLEX PO), Take by mouth daily., Disp: , Rfl:    EPINEPHrine 0.3 mg/0.3 mL IJ SOAJ injection, Inject into the muscle. (Patient not taking: Reported on 09/02/2022), Disp: , Rfl:   Allergies:  Allergies  Allergen Reactions   Bee Venom Anaphylaxis    Past Medical History, Surgical history, Social history, and Family History were reviewed and updated.  Review of Systems: Review of Systems  Constitutional: Negative.   HENT: Negative.    Eyes: Negative.   Respiratory: Negative.    Cardiovascular: Negative.   Gastrointestinal: Negative.   Genitourinary: Negative.   Musculoskeletal: Negative.   Skin:  Positive for rash.  Neurological: Negative.   Endo/Heme/Allergies: Negative.   Psychiatric/Behavioral: Negative.       Physical Exam:  height is  (1.753 m) and weight is 178 lb 1.9 oz (80.8 kg). Her oral  temperature is 99 F (37.2 C). Her blood pressure is 116/74 and her pulse is 61. Her respiration is 20 and oxygen saturation is 100%.   Wt Readings from Last 3 Encounters:  09/02/22 178 lb 1.9 oz (80.8 kg)  02/26/22 180 lb (81.6 kg)  08/27/21 170 lb 1.9 oz (77.2 kg)     Physical Exam Vitals reviewed.  HENT:     Head: Normocephalic and atraumatic.  Eyes:     Pupils: Pupils are equal, round, and reactive to light.  Cardiovascular:     Rate and Rhythm: Normal rate and regular rhythm.     Heart sounds: Normal heart sounds.  Pulmonary:     Effort: Pulmonary effort is normal.     Breath sounds: Normal breath sounds.  Abdominal:     General: Bowel sounds are normal.     Palpations: Abdomen is soft.  Musculoskeletal:        General: No tenderness or deformity. Normal range of motion.     Cervical back: Normal range of motion.  Lymphadenopathy:     Cervical: No cervical adenopathy.  Skin:    General: Skin is warm and dry.     Findings: No erythema or rash.  Neurological:     Mental Status: She is alert and oriented to person, place, and time.  Psychiatric:  Behavior: Behavior normal.        Thought Content: Thought content normal.        Judgment: Judgment normal.       Chemistry      Component Value Date/Time   NA 139 02/26/2022 1004   NA 139 01/15/2017 1419   K 4.3 02/26/2022 1004   K 3.9 01/15/2017 1419   CL 105 02/26/2022 1004   CL 104 01/15/2017 1419   CO2 29 02/26/2022 1004   CO2 26 01/15/2017 1419   BUN 15 02/26/2022 1004   BUN 16 01/15/2017 1419   CREATININE 0.80 02/26/2022 1004   CREATININE 0.81 01/15/2017 1419      Component Value Date/Time   CALCIUM 9.6 02/26/2022 1004   CALCIUM 9.8 01/15/2017 1419   ALKPHOS 54 02/26/2022 1004   ALKPHOS 56 01/15/2017 1419   AST 24 02/26/2022 1004   ALT 16 02/26/2022 1004   BILITOT 0.7 02/26/2022 1004      Impression and Plan: Ms. Vickroy is a 57 year old white female. She has chronic leukopenia. I been  following her for several years.  I did look at her blood smear.  Again, there is nothing on the blood smear that looks suspicious.  Again I suspect that she may have some element of myelodysplasia, even though the NGS panel that was sent off on her last time was negative for any mutations.  Again she has been totally asymptomatic.  I think we can still just follow her along.  I would like to get her back in 6 months for follow-up.  Josph Macho, MD 4/24/202410:19 AM

## 2022-10-09 ENCOUNTER — Emergency Department (HOSPITAL_COMMUNITY): Payer: BC Managed Care – PPO

## 2022-10-09 ENCOUNTER — Emergency Department (HOSPITAL_COMMUNITY)
Admission: EM | Admit: 2022-10-09 | Discharge: 2022-10-09 | Disposition: A | Discharge status: Home / Self Care | Payer: BC Managed Care – PPO | Attending: Emergency Medicine | Admitting: Emergency Medicine

## 2022-10-09 DIAGNOSIS — I639 Cerebral infarction, unspecified: Secondary | ICD-10-CM | POA: Diagnosis not present

## 2022-10-09 DIAGNOSIS — R791 Abnormal coagulation profile: Secondary | ICD-10-CM | POA: Insufficient documentation

## 2022-10-09 DIAGNOSIS — R278 Other lack of coordination: Secondary | ICD-10-CM | POA: Diagnosis not present

## 2022-10-09 DIAGNOSIS — H538 Other visual disturbances: Secondary | ICD-10-CM | POA: Diagnosis present

## 2022-10-09 DIAGNOSIS — H539 Unspecified visual disturbance: Secondary | ICD-10-CM

## 2022-10-09 LAB — CBC
HCT: 37.6 % (ref 36.0–46.0)
Hemoglobin: 12.2 g/dL (ref 12.0–15.0)
MCH: 27.6 pg (ref 26.0–34.0)
MCHC: 32.4 g/dL (ref 30.0–36.0)
MCV: 85.1 fL (ref 80.0–100.0)
Platelets: 158 10*3/uL (ref 150–400)
RBC: 4.42 MIL/uL (ref 3.87–5.11)
RDW: 12.8 % (ref 11.5–15.5)
WBC: 4 10*3/uL (ref 4.0–10.5)
nRBC: 0 % (ref 0.0–0.2)

## 2022-10-09 LAB — I-STAT CHEM 8, ED
BUN: 20 mg/dL (ref 6–20)
Calcium, Ion: 1.09 mmol/L — ABNORMAL LOW (ref 1.15–1.40)
Chloride: 104 mmol/L (ref 98–111)
Creatinine, Ser: 0.8 mg/dL (ref 0.44–1.00)
Glucose, Bld: 124 mg/dL — ABNORMAL HIGH (ref 70–99)
HCT: 36 % (ref 36.0–46.0)
Hemoglobin: 12.2 g/dL (ref 12.0–15.0)
Potassium: 3.9 mmol/L (ref 3.5–5.1)
Sodium: 139 mmol/L (ref 135–145)
TCO2: 27 mmol/L (ref 22–32)

## 2022-10-09 LAB — DIFFERENTIAL
Abs Immature Granulocytes: 0 10*3/uL (ref 0.00–0.07)
Band Neutrophils: 1 %
Basophils Absolute: 0 10*3/uL (ref 0.0–0.1)
Basophils Relative: 0 %
Eosinophils Absolute: 0 10*3/uL (ref 0.0–0.5)
Eosinophils Relative: 1 %
Lymphocytes Relative: 76 %
Lymphs Abs: 3 10*3/uL (ref 0.7–4.0)
Monocytes Absolute: 0 10*3/uL — ABNORMAL LOW (ref 0.1–1.0)
Monocytes Relative: 1 %
Neutro Abs: 0.9 10*3/uL — ABNORMAL LOW (ref 1.7–7.7)
Neutrophils Relative %: 21 %

## 2022-10-09 LAB — COMPREHENSIVE METABOLIC PANEL
ALT: 18 U/L (ref 0–44)
AST: 35 U/L (ref 15–41)
Albumin: 3.5 g/dL (ref 3.5–5.0)
Alkaline Phosphatase: 58 U/L (ref 38–126)
Anion gap: 5 (ref 5–15)
BUN: 16 mg/dL (ref 6–20)
CO2: 27 mmol/L (ref 22–32)
Calcium: 8.6 mg/dL — ABNORMAL LOW (ref 8.9–10.3)
Chloride: 102 mmol/L (ref 98–111)
Creatinine, Ser: 0.91 mg/dL (ref 0.44–1.00)
GFR, Estimated: >60 mL/min (ref >60)
Glucose, Bld: 128 mg/dL — ABNORMAL HIGH (ref 70–99)
Potassium: 3.7 mmol/L (ref 3.5–5.1)
Sodium: 134 mmol/L — ABNORMAL LOW (ref 135–145)
Total Bilirubin: 0.7 mg/dL (ref 0.3–1.2)
Total Protein: 6.8 g/dL (ref 6.5–8.1)

## 2022-10-09 LAB — ETHANOL: Alcohol, Ethyl (B): <10 mg/dL (ref <10)

## 2022-10-09 LAB — PROTIME-INR
INR: 1 (ref 0.8–1.2)
Prothrombin Time: 13.4 seconds (ref 11.4–15.2)

## 2022-10-09 LAB — APTT: aPTT: 26 seconds (ref 24–36)

## 2022-10-09 LAB — I-STAT BETA HCG BLOOD, ED (MC, WL, AP ONLY): I-stat hCG, quantitative: <5 m[IU]/mL (ref <5)

## 2022-10-09 MED ORDER — SODIUM CHLORIDE 0.9% FLUSH: 3.0000 mL | Freq: Once | INTRAVENOUS | Status: DC

## 2022-10-09 NOTE — Discharge Instructions (Signed)
Follow-up with the neurologist in the office.  I put an order for them to call you on the phone to set up an appointment.  If you not heard anything in a couple days please call them and let them know.  Please return for worsening or persistent symptoms.  Follow-up with your family doctor in the office.

## 2022-10-09 NOTE — ED Notes (Signed)
CBG -130 

## 2022-10-09 NOTE — ED Notes (Signed)
Patient transported to MRI 

## 2022-10-09 NOTE — Progress Notes (Signed)
Pt accompanied to restroom, no problems with ambulation noted. Urine specimen sent.

## 2022-10-09 NOTE — Code Documentation (Signed)
Monique Richardson is a 57 yr old female with a PMH of leukopenia presenting to [facility] via EMS on 10/09/2022. Pt is from home where she was LKW today at 1500, after which she had a sudden onset of dizziness and blurred vision. Pt is not on any blood thinners.    Pt met by Stroke Team at bridge. Labs, CBG drawn. Airway cleared by EDP. Pt to CT with team. NIHSS 0. Symptoms resolved since in ambulance. The following imaging was completed: CT. Per Dr. Otelia Limes, CT was negative for acute hemorrhage. Pt back to ED room 11 where her workup will continue. She will need q 2 hr VS and NIHSS for 12 hrs, then q 4. Bedside handoff with [name] RN complete. Pt not eligible for thrombolytic or thrombectomy due to total resolution of symptoms.

## 2022-10-09 NOTE — Consult Note (Addendum)
NEURO HOSPITALIST CONSULT NOTE   Requesting physician: Dr. Adela Lank  Reason for Consult: Vision changes and sluggish mentation  History obtained from:  Patient and Chart     HPI:                                                                                                                                          Monique Richardson is an 57 y.o. female with no significant PMHx who presents with acute onset of visual blurring bilaterally without field cut or dimming, in conjunction with a sense of unreality, dizziness and gait instability. She describes the dizziness as non-vertiginous, more like a fainting sensation. The unreality sensation is described as "feeling like I'm not really there". 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 She does not, however, recall being stung while walking her dog.   Time of symptom recognition was 1500 while at home using her smartphone, right after walking her dog. She was asymptomatic immediately prior to this. She has never had symptoms similar to this previously. She has no history of stroke or MI. She did have a mild headache earlier today but feels that the headache is unrelated to her current symptoms.   Vitals per EMS: BP 156/84, HR 92.   No past medical history on file.  No past surgical history on file.  Family History  Problem Relation Age of Onset   Breast cancer Maternal Grandmother               Social History:  reports that she quit smoking about 15 years ago. Her smoking use included cigarettes. She started smoking about 33 years ago. She has a 9.50 pack-year smoking history. She has never used smokeless tobacco. She reports that she does not drink alcohol and does not use drugs.  Allergies  Allergen Reactions   Bee Venom Anaphylaxis    MEDICATIONS:                                                                                                                       No current facility-administered medications on file prior to encounter.  Current Outpatient Medications on File Prior to Encounter  Medication Sig Dispense Refill   Cholecalciferol (VITAMIN D) 50 MCG (2000 UT) CAPS Take 4,000 Units by mouth daily.     EPINEPHrine 0.3 mg/0.3 mL IJ SOAJ injection Inject into the muscle. (Patient not taking: Reported on 09/02/2022)     Turmeric (QC TUMERIC COMPLEX PO) Take by mouth daily.       ROS:                                                                                                                                       As per HPI. Does not endorse any additional symptoms.   Wt 82 kg   BMI 26.70 kg/m   Weight 82 kg.   General Examination:                                                                                                       Physical Exam  HEENT:  [place]/AT Lungs: Respirations unlabored Extremities: Warm and well perfused.   Neurological Examination Mental Status: Awake and alert. Oriented x 5. Speech fluent with intact naming and comprehension. No dysarthria. Has some trouble recalling the exact events that happened earlier today, stating that her thinking is "cloudy". Anxious affect. Writing sample is normal.   Cranial Nerves: II: Temporal visual fields intact bilaterally with no extinction to DSS. PERRL 3 mm >> 2 mm. Visual acuity with spectacles is 20/50 OU.  III,IV, VI: No ptosis. EOMI. No nystagmus.  V: Temp sensation equal bilaterally VII: Smile symmetric VIII: Hearing intact to conversation IX,X: No hoarseness or hypophonia. Palate elevates symmetrically.  XI: Symmetric XII: Midline tongue extension Motor: BUE 5/5 proximally and distally without asymmetry, but some erratic giveway is noted.  BLE 5/5 proximally and distally without asymmetry, but some erratic giveway is noted.  No pronator drift Tone and bulk are normal x 4.  Sensory: Temp and  light touch intact throughout, bilaterally. No extinction to DSS.  Deep Tendon Reflexes: 2+ bilateral brachioradialis, biceps and triceps. 3+ bilateral patellae. Unable to elicit achilles reflexes. Toes downgoing bilaterally. Cerebellar: No ataxia with FNF or H-S bilaterally Gait: Exhibited some unsteadiness intermittently, which worsened with tandem gait. Normal stride length.   NIHSS: 0   Lab Results: Basic Metabolic Panel: No results for input(s): "NA", "K", "CL", "CO2", "GLUCOSE", "BUN", "CREATININE", "CALCIUM", "MG", "PHOS" in the last 168 hours.  CBC: No results for input(s): "WBC", "NEUTROABS", "HGB", "HCT", "MCV", "PLT" in the last 168 hours.  Cardiac Enzymes: No results for input(s): "CKTOTAL", "CKMB", "CKMBINDEX", "TROPONINI" in the last 168 hours.  Lipid Panel: No results for input(s): "CHOL", "TRIG", "HDL", "CHOLHDL", "VLDL", "LDLCALC" in the last 168 hours.  Imaging: No results found.   Assessment: 57 year old female with acute onset of vision changes and sluggish mentation - Exam reveals gait unsteadiness and decreased visual acuity with glasses on of 20/50 OU. NIHSS 0.  - CT head:  Negative with Aspects 10.  - DDx: - Functional disorder or stress-related symptoms are felt to be highest on the DDx.  - Also possible is a "stroke chameleon", whereby presenting symptoms aren't classically consistent with stroke, but in which imaging reveals an acute infarction.   Recommendations: - MRI brain with MRA head and neck (all without contrast) - Q2h neuro checks - TSH level - Basic labs - Further recommendations following MRI    Electronically signed: Dr. Caryl Pina 10/09/2022, 4:50 PM

## 2022-10-09 NOTE — ED Provider Notes (Signed)
[name] AT [facility] Provider Note   CSN: [id number] Arrival date & time: 10/09/22  1640  An emergency department physician performed an initial assessment on this suspected stroke patient at 1642.  History  Chief Complaint  Patient presents with   Code Stroke    Monique Richardson is a 57 y.o. female.  57 yo F with a chief complaints of a sudden change in vision and difficulty with coordination.  Patient arrived as a code stroke.  Level 5 caveat acuity of condition.        Home Medications Prior to Admission medications   Medication Sig Start Date End Date Taking? Authorizing Provider  Cholecalciferol (VITAMIN D) 50 MCG (2000 UT) CAPS Take 4,000 Units by mouth daily.    [provider]  EPINEPHrine 0.3 mg/0.3 mL IJ SOAJ injection Inject into the muscle. Patient not taking: Reported on 09/02/2022 11/03/18   [provider]  Turmeric (QC TUMERIC COMPLEX PO) Take by mouth daily.    [provider]      Allergies    Bee venom    Review of Systems   Review of Systems  Physical Exam Updated Vital Signs BP (!) 143/99   Pulse 91   Temp 98.9 F (37.2 C) (Oral)   Resp 16   Wt 82 kg   SpO2 100%   BMI 26.70 kg/m  Physical Exam Vitals and nursing note reviewed.  Constitutional:      General: She is not in acute distress.    Appearance: She is well-developed. She is not diaphoretic.  HENT:     Head: Normocephalic and atraumatic.  Eyes:     Pupils: Pupils are equal, round, and reactive to light.  Cardiovascular:     Rate and Rhythm: Normal rate and regular rhythm.     Heart sounds: No murmur heard.    No friction rub. No gallop.  Pulmonary:     Effort: Pulmonary effort is normal.     Breath sounds: No wheezing or rales.  Abdominal:     General: There is no distension.     Palpations: Abdomen is soft.     Tenderness: There is no abdominal tenderness.  Musculoskeletal:        General: No tenderness.      Cervical back: Normal range of motion and neck supple.  Skin:    General: Skin is warm and dry.  Neurological:     Mental Status: She is alert and oriented to person, place, and time.  Psychiatric:        Behavior: Behavior normal.     ED Results / Procedures / Treatments   Labs (all labs ordered are listed, but only abnormal results are displayed) Labs Reviewed  DIFFERENTIAL - Abnormal; Notable for the following components:      Result Value   Neutro Abs 0.9 (*)    Monocytes Absolute 0.0 (*)    All other components within normal limits  COMPREHENSIVE METABOLIC PANEL - Abnormal; Notable for the following components:   Sodium 134 (*)    Glucose, Bld 128 (*)    Calcium 8.6 (*)    All other components within normal limits  I-STAT CHEM 8, ED - Abnormal; Notable for the following components:   Glucose, Bld 124 (*)    Calcium, Ion 1.09 (*)    All other components within normal limits  PROTIME-INR  APTT  CBC  ETHANOL  RAPID URINE DRUG SCREEN, HOSP PERFORMED  CBG MONITORING, ED  I-STAT BETA HCG BLOOD, ED ([facility], [facility], [facility] ONLY)    EKG EKG Interpretation  Date/Time:  Friday Oct 09 2022 17:05:47 EDT Ventricular Rate:  97 PR Interval:  145 QRS Duration: 92 QT Interval:  370 QTC Calculation: 470 R Axis:   87 Text Interpretation: Sinus rhythm No old tracing to compare Confirmed by Melene Plan [phone]) on 10/09/2022 5:08:23 PM  Radiology MR BRAIN WO CONTRAST  Result Date: 10/09/2022 CLINICAL DATA:  Stroke suspected, visual changes, functional abnormalities EXAM: MRI HEAD WITHOUT CONTRAST MRA HEAD WITHOUT CONTRAST MRA NECK WITHOUT CONTRAST TECHNIQUE: Multiplanar, multi-echo pulse sequences of the brain and surrounding structures were acquired without intravenous contrast. Angiographic images of the Circle of Willis were acquired using MRA technique without intravenous contrast. Angiographic images of the neck were acquired using MRA technique without intravenous contrast. Carotid stenosis  measurements (when applicable) are obtained utilizing NASCET criteria, using the distal internal carotid diameter as the denominator. COMPARISON:  None Available. FINDINGS: MRI HEAD FINDINGS Brain: No restricted diffusion to suggest acute or subacute infarct. No acute hemorrhage, mass, mass effect, or midline shift. No hydrocephalus or extra-axial collection. Normal pituitary and craniocervical junction. No hemosiderin deposition to suggest remote hemorrhage. Normal cerebral volume for age. Skull and upper cervical spine: Normal marrow signal. Sinuses/Orbits: Clear paranasal sinuses. No acute finding in the orbits. Other: The mastoid air cells are well aerated. MRA HEAD FINDINGS Anterior circulation: Both internal carotid arteries are patent to the termini, without significant stenosis. A1 segments patent. Normal anterior communicating artery. Anterior cerebral arteries are patent to their distal aspects. No M1 stenosis or occlusion. Normal MCA bifurcations. Distal MCA branches perfused and symmetric. Posterior circulation: Vertebral arteries patent to the vertebrobasilar junction without stenosis. Posterior inferior cerebral arteries patent bilaterally. Basilar patent to its distal aspect. Superior cerebellar arteries patent bilaterally. Patent P1 segments. PCAs perfused to their distal aspects without stenosis. The right posterior communicating artery is patent. Anatomic variants: None significant MRA NECK FINDINGS Aortic arch: Not included in the field of view. Right carotid system: No evidence of stenosis, dissection, or occlusion. Left carotid system: No evidence of stenosis, dissection, or occlusion. Vertebral arteries: No evidence of stenosis, dissection, or occlusion. Other: None IMPRESSION: 1. No acute intracranial process. No evidence of acute or subacute infarct. 2. No intracranial large vessel occlusion or significant stenosis. 3. No hemodynamically significant stenosis in the neck. Electronically Signed    By: Wiliam Ke M.D.   On: 10/09/2022 21:30   MR ANGIO HEAD WO CONTRAST  Result Date: 10/09/2022 CLINICAL DATA:  Stroke suspected, visual changes, functional abnormalities EXAM: MRI HEAD WITHOUT CONTRAST MRA HEAD WITHOUT CONTRAST MRA NECK WITHOUT CONTRAST TECHNIQUE: Multiplanar, multi-echo pulse sequences of the brain and surrounding structures were acquired without intravenous contrast. Angiographic images of the Circle of Willis were acquired using MRA technique without intravenous contrast. Angiographic images of the neck were acquired using MRA technique without intravenous contrast. Carotid stenosis measurements (when applicable) are obtained utilizing NASCET criteria, using the distal internal carotid diameter as the denominator. COMPARISON:  None Available. FINDINGS: MRI HEAD FINDINGS Brain: No restricted diffusion to suggest acute or subacute infarct. No acute hemorrhage, mass, mass effect, or midline shift. No hydrocephalus or extra-axial collection. Normal pituitary and craniocervical junction. No hemosiderin deposition to suggest remote hemorrhage. Normal cerebral volume for age. Skull and upper cervical spine: Normal marrow signal. Sinuses/Orbits: Clear paranasal sinuses. No acute finding in the orbits. Other: The mastoid air cells are well aerated. MRA HEAD FINDINGS Anterior circulation: Both internal carotid  arteries are patent to the termini, without significant stenosis. A1 segments patent. Normal anterior communicating artery. Anterior cerebral arteries are patent to their distal aspects. No M1 stenosis or occlusion. Normal MCA bifurcations. Distal MCA branches perfused and symmetric. Posterior circulation: Vertebral arteries patent to the vertebrobasilar junction without stenosis. Posterior inferior cerebral arteries patent bilaterally. Basilar patent to its distal aspect. Superior cerebellar arteries patent bilaterally. Patent P1 segments. PCAs perfused to their distal aspects without  stenosis. The right posterior communicating artery is patent. Anatomic variants: None significant MRA NECK FINDINGS Aortic arch: Not included in the field of view. Right carotid system: No evidence of stenosis, dissection, or occlusion. Left carotid system: No evidence of stenosis, dissection, or occlusion. Vertebral arteries: No evidence of stenosis, dissection, or occlusion. Other: None IMPRESSION: 1. No acute intracranial process. No evidence of acute or subacute infarct. 2. No intracranial large vessel occlusion or significant stenosis. 3. No hemodynamically significant stenosis in the neck. Electronically Signed   By: Wiliam Ke M.D.   On: 10/09/2022 21:30   MR ANGIO NECK WO CONTRAST  Result Date: 10/09/2022 CLINICAL DATA:  Stroke suspected, visual changes, functional abnormalities EXAM: MRI HEAD WITHOUT CONTRAST MRA HEAD WITHOUT CONTRAST MRA NECK WITHOUT CONTRAST TECHNIQUE: Multiplanar, multi-echo pulse sequences of the brain and surrounding structures were acquired without intravenous contrast. Angiographic images of the Circle of Willis were acquired using MRA technique without intravenous contrast. Angiographic images of the neck were acquired using MRA technique without intravenous contrast. Carotid stenosis measurements (when applicable) are obtained utilizing NASCET criteria, using the distal internal carotid diameter as the denominator. COMPARISON:  None Available. FINDINGS: MRI HEAD FINDINGS Brain: No restricted diffusion to suggest acute or subacute infarct. No acute hemorrhage, mass, mass effect, or midline shift. No hydrocephalus or extra-axial collection. Normal pituitary and craniocervical junction. No hemosiderin deposition to suggest remote hemorrhage. Normal cerebral volume for age. Skull and upper cervical spine: Normal marrow signal. Sinuses/Orbits: Clear paranasal sinuses. No acute finding in the orbits. Other: The mastoid air cells are well aerated. MRA HEAD FINDINGS Anterior  circulation: Both internal carotid arteries are patent to the termini, without significant stenosis. A1 segments patent. Normal anterior communicating artery. Anterior cerebral arteries are patent to their distal aspects. No M1 stenosis or occlusion. Normal MCA bifurcations. Distal MCA branches perfused and symmetric. Posterior circulation: Vertebral arteries patent to the vertebrobasilar junction without stenosis. Posterior inferior cerebral arteries patent bilaterally. Basilar patent to its distal aspect. Superior cerebellar arteries patent bilaterally. Patent P1 segments. PCAs perfused to their distal aspects without stenosis. The right posterior communicating artery is patent. Anatomic variants: None significant MRA NECK FINDINGS Aortic arch: Not included in the field of view. Right carotid system: No evidence of stenosis, dissection, or occlusion. Left carotid system: No evidence of stenosis, dissection, or occlusion. Vertebral arteries: No evidence of stenosis, dissection, or occlusion. Other: None IMPRESSION: 1. No acute intracranial process. No evidence of acute or subacute infarct. 2. No intracranial large vessel occlusion or significant stenosis. 3. No hemodynamically significant stenosis in the neck. Electronically Signed   By: Wiliam Ke M.D.   On: 10/09/2022 21:30   CT HEAD CODE STROKE WO CONTRAST  Result Date: 10/09/2022 CLINICAL DATA:  Code stroke. Neuro deficit, acute, stroke suspected. Visual changes. Functional abnormalities. EXAM: CT HEAD WITHOUT CONTRAST TECHNIQUE: Contiguous axial images were obtained from the base of the skull through the vertex without intravenous contrast. RADIATION DOSE REDUCTION: This exam was performed according to the departmental dose-optimization program which includes automated exposure control,  adjustment of the mA and/or kV according to patient size and/or use of iterative reconstruction technique. COMPARISON:  None Available. FINDINGS: Brain: No acute  infarct, hemorrhage, or mass lesion is present. No significant white matter lesions are present. Deep brain nuclei are within normal limits. The ventricles are of normal size. No significant extraaxial fluid collection is present. The brainstem and cerebellum are within normal limits. Midline structures are within normal limits. Vascular: No hyperdense vessel or unexpected calcification. Skull: Calvarium is intact. No focal lytic or blastic lesions are present. No significant extracranial soft tissue lesion is present. Sinuses/Orbits: The paranasal sinuses and mastoid air cells are clear. The globes and orbits are within normal limits. ASPECTS [facility] Stroke Program Early CT Score) - Ganglionic level infarction (caudate, lentiform nuclei, internal capsule, insula, M1-M3 cortex): 7/7 - Supraganglionic infarction (M4-M6 cortex): 3/3 Total score (0-10 with 10 being normal): 10/10 IMPRESSION: 1. Negative CT of the head. 2. Aspects is 10/10. The above was relayed via text pager to Dr. Otelia Limes on 10/09/2022 at 17:05 . Electronically Signed   By: Marin Roberts M.D.   On: 10/09/2022 17:07    Procedures Procedures    Medications Ordered in ED Medications  sodium chloride flush (NS) 0.9 % injection 3 mL (3 mLs Intravenous Not Given 10/09/22 1718)    ED Course/ Medical Decision Making/ A&P                             Medical Decision Making Amount and/or Complexity of Data Reviewed Labs: ordered.   57 yo F with a chief complaints of acute onsets change in vision and coordination.  Patient arrived as a code stroke.  She was taken urgently back to CT after airway was clear to the bridge.  CT scan without intracranial hemorrhage on my independent interpretation.  Neurology Dr. Otelia Limes, evaluated and felt less likely to be acute stroke.  Felt not to be candidate for thrombolytics.  Plan to obtain an MRI/ mra.   Patient described her symptoms to me as she was walking and suddenly felt like she could not  see.  She tried to continue walking but felt like she was unsteady.  She mentioned that she felt like she was in a pass out at 1 point but is not sure if that is how she felt or not.  She feels much better now but just feels off and has trouble describing it.  CT of the head was negative for acute intracranial hemorrhage on my independent interpretation.  Pregnancy test negative, no significant electrolyte abnormalities.  MRI is negative for acute stroke.  I discussed this with the neurologist.  Recommending discharge home with follow-up with neurology in the office.  10:29 PM:  I have discussed the diagnosis/risks/treatment options with the patient and family.  Evaluation and diagnostic testing in the emergency department does not suggest an emergent condition requiring admission or immediate intervention beyond what has been performed at this time.  They will follow up with PCP, neuro. We also discussed returning to the ED immediately if new or worsening sx occur. We discussed the sx which are most concerning (e.g., sudden worsening pain, fever, inability to tolerate by mouth) that necessitate immediate return. Medications administered to the patient during their visit and any new prescriptions provided to the patient are listed below.  Medications given during this visit Medications  sodium chloride flush (NS) 0.9 % injection 3 mL (3 mLs Intravenous Not Given 10/09/22  1718)     The patient appears reasonably screen and/or stabilized for discharge and I doubt any other medical condition or other [facility] requiring further screening, evaluation, or treatment in the ED at this time prior to discharge.            Final Clinical Impression(s) / ED Diagnoses Final diagnoses:  Change in vision    Rx / DC Orders ED Discharge Orders          Ordered    Ambulatory referral to Neurology       Comments: Code stroke with negative MRI.   10/09/22 2220              Melene Plan,  DO 10/09/22 2229

## 2022-10-09 NOTE — ED Triage Notes (Signed)
Patient BIB EMS from home due to lost of coordination and blurred vision that started suddenly after walking dog. Patient last known normal @ 1500 today. Patient c/o Headache that improved on the way to hospital.  VSS.

## 2022-10-21 ENCOUNTER — Telehealth: Payer: Self-pay | Admitting: Neurology

## 2022-10-21 ENCOUNTER — Encounter: Payer: Self-pay | Admitting: Neurology

## 2022-10-21 ENCOUNTER — Ambulatory Visit: Payer: BC Managed Care – PPO | Admitting: Neurology

## 2022-10-21 VITALS — BP 118/67 | HR 66 | Ht 69.0 in | Wt 177.8 lb

## 2022-10-21 DIAGNOSIS — R4189 Other symptoms and signs involving cognitive functions and awareness: Secondary | ICD-10-CM

## 2022-10-21 DIAGNOSIS — H539 Unspecified visual disturbance: Secondary | ICD-10-CM

## 2022-10-21 NOTE — Telephone Encounter (Signed)
LVM and sent mychart msg asking pt to call back to schedule four month follow up with Dr. Pearlean Brownie and EEG

## 2022-10-21 NOTE — Patient Instructions (Addendum)
I had a long discussion with the patient with regards to her transient episode of vision disturbance as well as brain fog of unclear etiology and discussed results of neurovascular imaging remarkable.  I recommend further evaluation checking EEG, echocardiogram, 30-day heart monitor, B12, TSH, RPR, homocystine, ANA panel, lipid profile and hemoglobin A1c.  She did well on cognitive testing today but does have mild untreated depression.  I recommend she follow-up with her primary care physician for treatment of the same.  Return for follow-up in the future 4 months or call earlier if necessary.

## 2022-10-21 NOTE — Progress Notes (Addendum)
[facility] [address] [place]. [place] [address] [phone]       OFFICE CONSULT NOTE  Ms. HATLEY DELAO Date of Birth:  1965/09/22 Medical Record Number:  [id number]   Referring MD: Melene Plan  Reason for Referral: Vision difficulties  HPI: Ms. Lassiter is a 57 year old pleasant Caucasian lady seen today for initial office consultation visit.  History is obtained from the patient and review of electronic medical records.  I personally reviewed pertinent available imaging films in PACS. She has no significant past medical history with history of anxiety panic attacks..On 10/09/2022 with acute onset of episode of blurred vision bilaterally with a sense of feeling unreal, dizzy wobbly gait.  He described like having a brain fog and feeling like he is really not the.  He has had previous history of allergies to bee stings and felt she may be having.  She called EMS who evaluated her and recommended she go to the hospital.  She denies any complaints of headache, double vision, vertigo, slurred speech or focal extremity weakness.  When evaluated in the ER blood pressure was mildly elevated.  CT head was unremarkable.  MRI scan of the brain was also obtained which showed no acute abnormalities and was normal.  MR angiogram studies of the neck and the brain both with no large vessel stenosis or occlusion.  Patient states the symptoms lasted about an hour or so and resolved.  Has not recurred since then.  He has history of mild depression following death of her son 3 years ago.  She has never been formally evaluated for this or treated.  He is not presently on any medications.  She denies any prior history of strokes, TIAs, seizures, migraines or any significant other neurological problems. ROS:   14 system review of systems is positive for vision difficulties, dizziness, imbalance, brain fog, depression and all other systems negative  PMH: History reviewed. No pertinent past  medical history.  Social History:  Social History   Socioeconomic History   Marital status: Married    Spouse name: Not on file   Number of children: Not on file   Years of education: Not on file   Highest education level: Not on file  Occupational History   Not on file  Tobacco Use   Smoking status: Former    Packs/day: 0.50    Years: 19.00    Additional pack years: 0.00    Total pack years: 9.50    Types: Cigarettes    Start date: 03/23/1989    Quit date: 07/25/2007    Years since quitting: 15.2   Smokeless tobacco: Never   Tobacco comments:    NOT USING ANY TOBACCO  Vaping Use   Vaping Use: Never used  Substance and Sexual Activity   Alcohol use: Never    Alcohol/week: 0.0 standard drinks of alcohol   Drug use: Never   Sexual activity: Not Currently  Other Topics Concern   Not on file  Social History Narrative   Not on file   Social Determinants of Health   Financial Resource Strain: Not on file  Food Insecurity: Not on file  Transportation Needs: Not on file  Physical Activity: Not on file  Stress: Not on file  Social Connections: Not on file  Intimate Partner Violence: Not on file    Medications:   Current Outpatient Medications on File Prior to Visit  Medication Sig Dispense Refill   Cholecalciferol (VITAMIN D) 50 MCG (2000 UT) CAPS Take  4,000 Units by mouth daily.     magnesium citrate SOLN Take 1 Bottle by mouth once.     Turmeric (QC TUMERIC COMPLEX PO) Take by mouth daily.     EPINEPHrine 0.3 mg/0.3 mL IJ SOAJ injection Inject into the muscle. (Patient not taking: Reported on 09/02/2022)     No current facility-administered medications on file prior to visit.    Allergies:   Allergies  Allergen Reactions   Bee Venom Anaphylaxis    Physical Exam General: well developed, well nourished pleasant middle-aged Caucasian lady, seated, in no evident distress Head: head normocephalic and atraumatic.   Neck: supple with no carotid or supraclavicular  bruits Cardiovascular: regular rate and rhythm, no murmurs Musculoskeletal: no deformity Skin:  no rash/petichiae Vascular:  Normal pulses all extremities  Neurologic Exam Mental Status: Awake and fully alert. Oriented to place and time. Recent and remote memory intact. Attention span, concentration and fund of knowledge appropriate. Mood and affect appropriate.  MMSE score 30/30 without any deficits.  She was able to name 17 animals that can walk on 4 legs.  Clock drawing 3/4. Cranial Nerves: Fundoscopic exam reveals sharp disc margins. Pupils equal, briskly reactive to light. Extraocular movements full without nystagmus. Visual fields full to confrontation. Hearing intact. Facial sensation intact. Face, tongue, palate moves normally and symmetrically.  Motor: Normal bulk and tone. Normal strength in all tested extremity muscles. Sensory.: intact to touch , pinprick , position and vibratory sensation.  Coordination: Rapid alternating movements normal in all extremities. Finger-to-nose and heel-to-shin performed accurately bilaterally. Gait and Station: Arises from chair without difficulty. Stance is normal. Gait demonstrates normal stride length and balance . Able to heel, toe and tandem walk without difficulty.  Reflexes: 1+ and symmetric. Toes downgoing.   NIHSS  0 Modified Rankin  0      No data to display          ASSESSMENT: 57 year old Caucasian lady with transient episode of vision likely along with subjective feeling of brain fog of unclear etiology.  Negative neurovascular workup.  She has mild untreated depression.  Complicating     PLAN:I had a long discussion with the patient with regards to her transient episode of vision disturbance as well as brain fog of unclear etiology and discussed results of neurovascular imaging remarkable.  I recommend further evaluation checking EEG, echocardiogram, 30-day heart monitor, B12, TSH, RPR, homocystine, ANA panel, lipid profile and  hemoglobin A1c.  She did well on cognitive testing today but does have mild untreated depression.  I recommend she follow-up with her primary care physician for treatment of the same.  Return for follow-up in the future 4 months or call earlier if necessary.  Greater than 50% time during this 45-minute consultation visit with spent on counseling and coordination of care about her episode of transient vision difficulties and discussion about differential diagnosis and evaluation and answering questions Delia Heady, MD Note: This document was prepared with digital dictation and possible smart phrase technology. Any transcriptional errors that result from this process are unintentional.

## 2022-10-22 LAB — ANA COMPREHENSIVE PANEL
Anti JO-1: <0.2 AI (ref 0.0–0.9)
Centromere Ab Screen: <0.2 AI (ref 0.0–0.9)
Chromatin Ab SerPl-aCnc: <0.2 AI (ref 0.0–0.9)
ENA RNP Ab: <0.2 AI (ref 0.0–0.9)
ENA SM Ab Ser-aCnc: <0.2 AI (ref 0.0–0.9)
ENA SSA (RO) Ab: <0.2 AI (ref 0.0–0.9)
ENA SSB (LA) Ab: <0.2 AI (ref 0.0–0.9)
Scleroderma (Scl-70) (ENA) Antibody, IgG: <0.2 AI (ref 0.0–0.9)
dsDNA Ab: 2 IU/mL (ref 0–9)

## 2022-10-22 LAB — LIPID PANEL
Chol/HDL Ratio: 5.2 ratio — ABNORMAL HIGH (ref 0.0–4.4)
Cholesterol, Total: 262 mg/dL — ABNORMAL HIGH (ref 100–199)
HDL: 50 mg/dL (ref >39)
LDL Chol Calc (NIH): 178 mg/dL — ABNORMAL HIGH (ref 0–99)
Triglycerides: 182 mg/dL — ABNORMAL HIGH (ref 0–149)
VLDL Cholesterol Cal: 34 mg/dL (ref 5–40)

## 2022-10-22 LAB — DEMENTIA PANEL
Homocysteine: 11.1 umol/L (ref 0.0–14.5)
RPR Ser Ql: NONREACTIVE
TSH: 1.79 u[IU]/mL (ref 0.450–4.500)
Vitamin B-12: 512 pg/mL (ref 232–1245)

## 2022-10-22 LAB — HEMOGLOBIN A1C
Est. average glucose Bld gHb Est-mCnc: 111 mg/dL
Hgb A1c MFr Bld: 5.5 % (ref 4.8–5.6)

## 2022-10-22 NOTE — Progress Notes (Signed)
Kindly inform the patient that lab work for reversible causes of memory loss, vasculitis and screening test for diabetes are all satisfactory.  Cholesterol profile is not optimal and both triglycerides and bad cholesterol are elevated.  Recommend she see her primary care physician to discuss management of this.

## 2022-10-26 ENCOUNTER — Telehealth: Payer: Self-pay

## 2022-10-26 DIAGNOSIS — H539 Unspecified visual disturbance: Secondary | ICD-10-CM | POA: Diagnosis not present

## 2022-10-26 DIAGNOSIS — R4189 Other symptoms and signs involving cognitive functions and awareness: Secondary | ICD-10-CM

## 2022-10-26 NOTE — Telephone Encounter (Signed)
-----   Message from Micki Riley, MD sent at 10/22/2022  8:37 PM EDT ----- Monique Richardson inform the patient that lab work for reversible causes of memory loss, vasculitis and screening test for diabetes are all satisfactory.  Cholesterol profile is not optimal and both triglycerides and bad cholesterol are elevated.  Recommend she see her primary care physician to discuss management of this.

## 2022-10-26 NOTE — Telephone Encounter (Signed)
I called patient to discuss. No answer, left a message asking her to call us back. Please route to POD 3 when patient returns call. I will also send a mychart message.

## 2022-11-09 ENCOUNTER — Ambulatory Visit: Payer: BC Managed Care – PPO | Admitting: Neurology

## 2022-11-09 DIAGNOSIS — R4182 Altered mental status, unspecified: Secondary | ICD-10-CM | POA: Diagnosis not present

## 2022-11-09 DIAGNOSIS — R4189 Other symptoms and signs involving cognitive functions and awareness: Secondary | ICD-10-CM

## 2022-11-09 DIAGNOSIS — H539 Unspecified visual disturbance: Secondary | ICD-10-CM

## 2022-11-18 ENCOUNTER — Encounter: Payer: Self-pay | Admitting: Neurology

## 2022-11-27 NOTE — Progress Notes (Signed)
Kindly inform the patient that EEG or brainwave study was normal

## 2022-12-01 ENCOUNTER — Ambulatory Visit: Payer: BC Managed Care – PPO | Attending: Neurology

## 2022-12-01 DIAGNOSIS — R4189 Other symptoms and signs involving cognitive functions and awareness: Secondary | ICD-10-CM

## 2022-12-01 DIAGNOSIS — H539 Unspecified visual disturbance: Secondary | ICD-10-CM

## 2022-12-07 ENCOUNTER — Encounter: Payer: Self-pay | Admitting: Neurology

## 2022-12-07 NOTE — Progress Notes (Signed)
Kindly inform the patient that 30-day heart monitor study showed no evidence of atrial fibrillation or any worrisome arrhythmia

## 2023-02-22 ENCOUNTER — Ambulatory Visit: Payer: BC Managed Care – PPO | Admitting: Adult Health

## 2023-02-22 ENCOUNTER — Encounter: Payer: Self-pay | Admitting: Adult Health

## 2023-02-22 VITALS — BP 113/76 | HR 85 | Ht 69.0 in | Wt 178.0 lb

## 2023-02-22 DIAGNOSIS — H539 Unspecified visual disturbance: Secondary | ICD-10-CM | POA: Diagnosis not present

## 2023-02-22 NOTE — Progress Notes (Signed)
[facility] [address] [place]. [place] [address] [phone]       OFFICE FOLLOW UP NOTE  Monique Richardson Date of Birth:  11/24/1965 Medical Record Number:  [id number]    Primary neurologist: Dr. Pearlean Richardson Reason for visit: Transient vision difficulties, brain fog   Chief Complaint  Patient presents with   Follow-up    Patient in room #8 and alone. Patient states she is well and stable with no new concerns.      HPI:  Update 02/22/2023 JM: Patient returns for follow-up visit.  Denies any recurrence of vision changes or brain fog sensation.  Further workup including lab work and EEG largely unremarkable.  Did note elevated cholesterol levels and recommended follow-up with PCP for further management. She reports long standing hx of elevated cholesterol levels which runs in her family, declines treatment, trying to work on dietary changes.  Denies any recurrent episodes or new neurological symptoms. Does report continued underlying depression since her son passed away in 07/18/21 (previously documented 3 years ago). Not currently followed by Monique Richardson but feels managing okay, has good support group through family and church members.       History provided for reference purposes only Consult visit 10/21/2022 Dr. Pearlean Richardson:  Monique Richardson is a 57 year old pleasant Caucasian lady seen today for initial office consultation visit.  History is obtained from the patient and review of electronic medical records.  I personally reviewed pertinent available imaging films in PACS. She has no significant past medical history with history of anxiety panic attacks..On 10/09/2022 with acute onset of episode of blurred vision bilaterally with a sense of feeling unreal, dizzy wobbly gait.  He described like having a brain fog and feeling like he is really not the.  He has had previous history of allergies to bee stings and felt she may be having.  She called EMS who evaluated her and recommended  she go to the hospital.  She denies any complaints of headache, double vision, vertigo, slurred speech or focal extremity weakness.  When evaluated in the ER blood pressure was mildly elevated.  CT head was unremarkable.  MRI scan of the brain was also obtained which showed no acute abnormalities and was normal.  MR angiogram studies of the neck and the brain both with no large vessel stenosis or occlusion.  Patient states the symptoms lasted about an hour or so and resolved.  Has not recurred since then.  He has history of mild depression following death of her son 3 years ago.  She has never been formally evaluated for this or treated.  He is not presently on any medications.  She denies any prior history of strokes, TIAs, seizures, migraines or any significant other neurological problems.    ROS:   14 system review of systems is positive for those listed in HPI and all other systems negative  PMH: History reviewed. No pertinent past medical history.  Social History:  Social History   Socioeconomic History   Marital status: Married    Spouse name: Not on file   Number of children: Not on file   Years of education: Not on file   Highest education level: Not on file  Occupational History   Not on file  Tobacco Use   Smoking status: Former    Current packs/day: 0.00    Average packs/day: 0.5 packs/day for 19.0 years (9.5 ttl pk-yrs)    Types: Cigarettes    Start date: 03/23/1989    Quit  date: 07/25/2007    Years since quitting: 15.5   Smokeless tobacco: Never   Tobacco comments:    NOT USING ANY TOBACCO  Vaping Use   Vaping status: Never Used  Substance and Sexual Activity   Alcohol use: Never    Alcohol/week: 0.0 standard drinks of alcohol   Drug use: Never   Sexual activity: Not Currently  Other Topics Concern   Not on file  Social History Narrative   Not on file   Social Determinants of Health   Financial Resource Strain: Not on file  Food Insecurity: Not on file   Transportation Needs: Not on file  Physical Activity: Not on file  Stress: Not on file  Social Connections: Unknown (09/23/2021)   Received from [facility]   Social Network    Social Network: Not on file  Intimate Partner Violence: Unknown (08/15/2021)   [facility] from [facility]   HITS    Physically Hurt: Not on file    Insult or Talk Down To: Not on file    Threaten Physical Harm: Not on file    Scream or Curse: Not on file    Medications:   Current Outpatient Medications on File Prior to Visit  Medication Sig Dispense Refill   Cholecalciferol (VITAMIN D) 50 MCG (2000 UT) CAPS Take 4,000 Units by mouth daily.     magnesium citrate SOLN Take 1 Bottle by mouth once.     Turmeric (QC TUMERIC COMPLEX PO) Take by mouth daily.     EPINEPHrine 0.3 mg/0.3 mL IJ SOAJ injection Inject into the muscle. (Patient not taking: Reported on 09/02/2022)     No current facility-administered medications on file prior to visit.    Allergies:   Allergies  Allergen Reactions   Bee Venom Anaphylaxis   Sulfamethoxazole Anaphylaxis    Pt reports she doesn't recall reactions to medication. Pt unsure    Physical Exam Today's Vitals   02/22/23 1421  BP: 113/76  Pulse: 85  Weight: 178 lb (80.7 kg)  Height: 5\' 9"  (1.753 m)   Body mass index is 26.29 kg/m.  General: well developed, well nourished 57 year pleasant middle-aged Caucasian lady, seated, in no evident distress Head: head normocephalic and atraumatic.   Neck: supple with no carotid or supraclavicular bruits Cardiovascular: regular rate and rhythm, no murmurs Musculoskeletal: no deformity Skin:  no rash/petichiae Vascular:  Normal pulses all extremities  Neurologic Exam Mental Status: Awake and fully alert. Oriented to place and time. Recent and remote memory intact. Attention span, concentration and fund of knowledge appropriate. Mood and affect appropriate.   Cranial Nerves:  Pupils equal, briskly  reactive to light. Extraocular movements full without nystagmus. Visual fields full to confrontation. Hearing intact. Facial sensation intact. Face, tongue, palate moves normally and symmetrically.  Motor: Normal bulk and tone. Normal strength in all tested extremity muscles. Sensory.: intact to touch , pinprick , position and vibratory sensation.  Coordination: Rapid alternating movements normal in all extremities. Finger-to-nose and heel-to-shin performed accurately bilaterally. Gait and Station: Arises from chair without difficulty. Stance is normal. Gait demonstrates normal stride length and balance without use of AD Reflexes: 1+ and symmetric. Toes downgoing.        ASSESSMENT: 57 year old Caucasian lady with transient episode of vision impairment along with subjective feeling of brain fog of unclear etiology.  Negative neurovascular workup.  She has mild untreated depression possibly complicating. Further work up including lab work, cardiac monitor and EEG unremarkable.  PLAN:  -No further recommendations or workup indicated at this time -She was advised to call 911 with any recurrent episodes for emergent evaluation -does have uncontrolled HLD, declines interest in medications for treatment, working on dietary changes, advised ongoing monitoring need for future management by PCP    Stable from neurological standpoint without further recommendations. Can follow up as needed at this time.      I spent 25 minutes of face-to-face and non-face-to-face time with patient.  This included previsit chart review, lab review, study review, order entry, electronic health record documentation, patient education and discussion regarding above diagnoses and treatment plan and answered all other questions to patient's satisfaction  Ihor Austin, [employer]  [facility] [address] [place], [place] [address]  Phone [phone] Fax [phone] Note:  This document was prepared with digital dictation and possible smart phrase technology. Any transcriptional errors that result from this process are unintentional.

## 2023-02-22 NOTE — Patient Instructions (Addendum)
No further recommendations or workup indicated at this time  Please call 911 with any recurrent episodes for emergent evaluation        Thank you for coming to see [place] at [facility]. I hope we have been able to provide you high quality care today.  You may receive a patient satisfaction survey over the next few weeks. We would appreciate your feedback and comments so that we may continue to improve ourselves and the health of our patients.

## 2023-03-04 ENCOUNTER — Encounter: Payer: Self-pay | Admitting: Medical Oncology

## 2023-03-04 ENCOUNTER — Inpatient Hospital Stay: Payer: BC Managed Care – PPO | Admitting: Medical Oncology

## 2023-03-04 ENCOUNTER — Inpatient Hospital Stay: Payer: BC Managed Care – PPO | Attending: Hematology & Oncology

## 2023-03-04 VITALS — BP 120/79 | HR 70 | Temp 98.7°F | Resp 18 | Wt 177.1 lb

## 2023-03-04 DIAGNOSIS — D729 Disorder of white blood cells, unspecified: Secondary | ICD-10-CM

## 2023-03-04 DIAGNOSIS — D469 Myelodysplastic syndrome, unspecified: Secondary | ICD-10-CM | POA: Insufficient documentation

## 2023-03-04 LAB — CBC WITH DIFFERENTIAL (CANCER CENTER ONLY)
Abs Immature Granulocytes: 0 10*3/uL (ref 0.00–0.07)
Basophils Absolute: 0 10*3/uL (ref 0.0–0.1)
Basophils Relative: 1 %
Eosinophils Absolute: 0.1 10*3/uL (ref 0.0–0.5)
Eosinophils Relative: 3 %
HCT: 41.5 % (ref 36.0–46.0)
Hemoglobin: 13.7 g/dL (ref 12.0–15.0)
Immature Granulocytes: 0 %
Lymphocytes Relative: 76 %
Lymphs Abs: 2.6 10*3/uL (ref 0.7–4.0)
MCH: 28 pg (ref 26.0–34.0)
MCHC: 33 g/dL (ref 30.0–36.0)
MCV: 84.9 fL (ref 80.0–100.0)
Monocytes Absolute: 0.3 10*3/uL (ref 0.1–1.0)
Monocytes Relative: 7 %
Neutro Abs: 0.4 10*3/uL — CL (ref 1.7–7.7)
Neutrophils Relative %: 13 %
Platelet Count: 142 10*3/uL — ABNORMAL LOW (ref 150–400)
RBC: 4.89 MIL/uL (ref 3.87–5.11)
RDW: 12.9 % (ref 11.5–15.5)
Smear Review: NORMAL
WBC Count: 3.4 10*3/uL — ABNORMAL LOW (ref 4.0–10.5)
nRBC: 0 % (ref 0.0–0.2)

## 2023-03-04 LAB — CMP (CANCER CENTER ONLY)
ALT: 20 U/L (ref 0–44)
AST: 28 U/L (ref 15–41)
Albumin: 4.2 g/dL (ref 3.5–5.0)
Alkaline Phosphatase: 48 U/L (ref 38–126)
Anion gap: 5 (ref 5–15)
BUN: 12 mg/dL (ref 6–20)
CO2: 32 mmol/L (ref 22–32)
Calcium: 9.6 mg/dL (ref 8.9–10.3)
Chloride: 104 mmol/L (ref 98–111)
Creatinine: 0.78 mg/dL (ref 0.44–1.00)
GFR, Estimated: >60 mL/min (ref >60)
Glucose, Bld: 97 mg/dL (ref 70–99)
Potassium: 4.6 mmol/L (ref 3.5–5.1)
Sodium: 141 mmol/L (ref 135–145)
Total Bilirubin: 0.9 mg/dL (ref 0.3–1.2)
Total Protein: 7.6 g/dL (ref 6.5–8.1)

## 2023-03-04 LAB — LACTATE DEHYDROGENASE: LDH: 173 U/L (ref 98–192)

## 2023-03-04 LAB — SAVE SMEAR(SSMR), FOR PROVIDER SLIDE REVIEW

## 2023-03-04 NOTE — Progress Notes (Signed)
Hematology and Oncology Follow Up Visit  Monique Richardson [id number] 1965-10-25 57 y.o. 03/04/2023   Principle Diagnosis:  Chronic leukopenia --  NGS panel is (-)   Current Therapy:   Observation     Interim History:  Monique Richardson is back for follow-up.  We see her every 6 months.   She reports that she has been doing ok since her last visit.   She has had no problem with infections.  She has had no fever.  She has had no probably COVID.  There has been no bleeding to her knowledge: denies epistaxis, gingivitis, hemoptysis, hematemesis, hematuria, melena, excessive bruising, blood donation.   There is been no change in bowel or bladder habits.  She has had no rashes.  She has had no swollen lymph nodes.  Currently, I would say performance status is probably ECOG 0.  Wt Readings from Last 3 Encounters:  02/22/23 178 lb (80.7 kg)  10/21/22 177 lb 12.8 oz (80.6 kg)  10/09/22 180 lb 12.4 oz (82 kg)    Medications:  Current Outpatient Medications:    Cholecalciferol (VITAMIN D) 50 MCG (2000 UT) CAPS, Take 4,000 Units by mouth daily., Disp: , Rfl:    EPINEPHrine 0.3 mg/0.3 mL IJ SOAJ injection, Inject into the muscle. (Patient not taking: Reported on 09/02/2022), Disp: , Rfl:    magnesium citrate SOLN, Take 1 Bottle by mouth once., Disp: , Rfl:    Turmeric (QC TUMERIC COMPLEX PO), Take by mouth daily., Disp: , Rfl:   Allergies:  Allergies  Allergen Reactions   Bee Venom Anaphylaxis   Sulfamethoxazole Anaphylaxis    Pt reports she doesn't recall reactions to medication. Pt unsure    Past Medical History, Surgical history, Social history, and Family History were reviewed and updated.  Review of Systems: Review of Systems  Constitutional: Negative.   HENT: Negative.    Eyes: Negative.   Respiratory: Negative.    Cardiovascular: Negative.   Gastrointestinal: Negative.   Genitourinary: Negative.   Musculoskeletal: Negative.   Skin:  Negative for rash.  Neurological:  Negative.   Endo/Heme/Allergies: Negative.   Psychiatric/Behavioral: Negative.     Physical Exam:  vitals were not taken for this visit.   Wt Readings from Last 3 Encounters:  02/22/23 178 lb (80.7 kg)  10/21/22 177 lb 12.8 oz (80.6 kg)  10/09/22 180 lb 12.4 oz (82 kg)   Physical Exam Vitals reviewed.  HENT:     Head: Normocephalic and atraumatic.  Eyes:     Pupils: Pupils are equal, round, and reactive to light.  Cardiovascular:     Rate and Rhythm: Normal rate and regular rhythm.     Heart sounds: Normal heart sounds.  Pulmonary:     Effort: Pulmonary effort is normal.     Breath sounds: Normal breath sounds.  Abdominal:     General: Bowel sounds are normal.     Palpations: Abdomen is soft.  Musculoskeletal:        General: No tenderness or deformity. Normal range of motion.     Cervical back: Normal range of motion.  Lymphadenopathy:     Cervical: No cervical adenopathy.  Skin:    General: Skin is warm and dry.     Findings: No erythema or rash.  Neurological:     Mental Status: She is alert and oriented to person, place, and time.  Psychiatric:        Behavior: Behavior normal.        Thought Content: Thought content  normal.        Judgment: Judgment normal.       Chemistry      Component Value Date/Time   NA 139 10/09/2022 1651   NA 139 01/15/2017 1419   K 3.9 10/09/2022 1651   K 3.9 01/15/2017 1419   CL 104 10/09/2022 1651   CL 104 01/15/2017 1419   CO2 27 10/09/2022 1644   CO2 26 01/15/2017 1419   BUN 20 10/09/2022 1651   BUN 16 01/15/2017 1419   CREATININE 0.80 10/09/2022 1651   CREATININE 0.77 09/02/2022 0908   CREATININE 0.81 01/15/2017 1419      Component Value Date/Time   CALCIUM 8.6 (L) 10/09/2022 1644   CALCIUM 9.8 01/15/2017 1419   ALKPHOS 58 10/09/2022 1644   ALKPHOS 56 01/15/2017 1419   AST 35 10/09/2022 1644   AST 27 09/02/2022 0908   ALT 18 10/09/2022 1644   ALT 17 09/02/2022 0908   BILITOT 0.7 10/09/2022 1644   BILITOT 0.7  09/02/2022 0908     Encounter Diagnosis  Name Primary?   Abnormal neutrophil count Yes    Impression and Plan: Monique Richardson is a 57 year old white female. She has chronic leukopenia. [name] for several years. Suspicion is that she has some element of myelodysplasia, even though the NGS panel that was sent off on her last time was negative for any mutations.  CBC reviewed. ANC once gain is 0.4. WBC is fairly stable at 3.4. Platelets are actually a bit improved.  She is asymptomatic.  We will continue observation  RTC 6 months MD, labs (CBC w/, CMP, LDH, save smear)-[place]  Rushie Chestnut, PA-C [phone] AM

## 2023-03-11 ENCOUNTER — Ambulatory Visit: Payer: BC Managed Care – PPO | Admitting: Podiatry

## 2023-03-11 DIAGNOSIS — M722 Plantar fascial fibromatosis: Secondary | ICD-10-CM

## 2023-03-11 DIAGNOSIS — M7752 Other enthesopathy of left foot: Secondary | ICD-10-CM | POA: Diagnosis not present

## 2023-03-11 MED ORDER — MELOXICAM 7.5 MG PO TABS: 7.5000 mg | ORAL_TABLET | Freq: Every day | ORAL | 0 refills | Status: DC

## 2023-03-11 MED ORDER — TRIAMCINOLONE ACETONIDE 10 MG/ML IJ SUSP
10.0000 mg | Freq: Once | INTRAMUSCULAR | Status: AC
  Administered 2023-03-11: 10 mg via INTRAMUSCULAR

## 2023-03-11 NOTE — Progress Notes (Signed)
   HPI: 57 y.o. female presenting today with c/o pain in the bottom of the bilateral heel.  Pain is rated as 9/10.  Left plantar heel hurts worse than the right.  Patient states that she has been trying to deal with this on her own but is not getting any better.  Denies injury  No past medical history on file.  No past surgical history on file.  Allergies  Allergen Reactions   Bee Venom Anaphylaxis   Sulfamethoxazole Anaphylaxis    Pt reports she doesn't recall reactions to medication. Pt unsure     Physical Exam: General: The patient is alert and oriented x3 in no acute distress.  Dermatology:  No ecchymosis, erythema, or edema bilateral.  No open lesions.    Vascular: Palpable pedal pulses bilaterally. Capillary refill within normal limits.  No appreciable edema.    Neurological: Light touch sensation intact bilateral.  MMT 5/5 to lower extremity bilateral. Negative Tinel's sign with percussion of the posterior tibial nerve on the affected extremity.    Musculoskeletal Exam:  There is pain on palpation of the plantarmedial & plantarcentral aspect of bilateral heel.  No gaps or nodules within the plantar fascia, but there is some tightness palpated within the medial and central plantar fascial bands.  Positive Windlass mechanism bilateral.  Antalgic gait noted with first few steps upon standing.  No pain on palpation of achilles tendon bilateral.  Ankle df less than 10 degrees with knee extended b/l.  Assessment/Plan of Care: 1. Bilateral plantar fasciitis   2. Bursitis of left foot     Meds ordered this encounter  Medications   meloxicam (MOBIC) 7.5 MG tablet    Sig: Take 1 tablet (7.5 mg total) by mouth daily.    Dispense:  30 tablet    Refill:  0   triamcinolone acetonide (KENALOG) 10 MG/ML injection 10 mg   -Reviewed etiology of plantar fasciitis with patient.  Discussed treatment options with patient today, including cortisone injection, NSAID course of treatment,  stretching exercises, physical therapy, use of night splint, rest, icing the heel, arch supports/orthotics, and supportive shoe gear.    With the patient's verbal consent, a corticosteroid injection was administered to the bilaterally heel, consisting of a mixture of 1% lidocaine plain, 0.5% Sensorcaine plain, and Kenalog-10 for a total of 1.5cc administered to each heel.  A Band-aid was applied. Pain level post-injection is 7/10.  Patient was fitted for power step inserts size 10 women's.  She was instructed how to put these in her shoes and wear them.  Patient was fitted for a night splint for the left foot.  This is a static AFO with a soft interface material to be worn when nonweightbearing/sleeping only.  Prescription for meloxicam 7.5 mg sent to her pharmacy for daily use as needed for pain  Return in about 5 weeks (around 04/15/2023) for f/u plantar fasciitis.   Clerance Lav, DPM, FACFAS [facility]     [address] [place], [place] [address]                Office [phone]  Fax [phone]

## 2023-03-11 NOTE — Patient Instructions (Signed)

## 2023-05-15 LAB — EXTERNAL GENERIC LAB PROCEDURE: COLOGUARD: POSITIVE — AB

## 2023-05-18 ENCOUNTER — Ambulatory Visit (INDEPENDENT_AMBULATORY_CARE_PROVIDER_SITE_OTHER): Payer: BC Managed Care – PPO

## 2023-05-18 ENCOUNTER — Ambulatory Visit: Payer: BC Managed Care – PPO | Admitting: Podiatry

## 2023-05-18 DIAGNOSIS — M7732 Calcaneal spur, left foot: Secondary | ICD-10-CM

## 2023-05-18 DIAGNOSIS — M722 Plantar fascial fibromatosis: Secondary | ICD-10-CM

## 2023-05-18 NOTE — Progress Notes (Signed)
      Chief Complaint  Patient presents with   Plantar Fasciitis    Bilateral plantar fasciitis left over right   HPI: 58 y.o. female presenting today for f/u of bilateral plantar fasciitis.  The left foot is worse than the right.  She was last seen in October and is now following up today.  States that she had minimal improvement from the cortisone injection and medication.  Not interested in another cortisone injection.   History reviewed. No pertinent past medical history.  History reviewed. No pertinent surgical history.  Allergies  Allergen Reactions   Bee Venom Anaphylaxis   Sulfamethoxazole Anaphylaxis    Pt reports she doesn't recall reactions to medication. Pt unsure     Physical Exam: General: The patient is alert and oriented x3 in no acute distress.  Dermatology:  No ecchymosis, erythema, or edema bilateral.  No open lesions.    Vascular: Palpable pedal pulses bilaterally. Capillary refill within normal limits.  No appreciable edema.    Neurological: Light touch sensation intact bilateral.  MMT 5/5 to lower extremity bilateral. Negative Tinel's sign with percussion of the posterior tibial nerve on the affected extremity.    Musculoskeletal Exam:  There is pain on palpation of the plantarmedial & plantarcentral aspect of left heel.  Minimal pain in plantar right heel.  No gaps or nodules within the plantar fascia.  Positive Windlass mechanism bilateral.  Antalgic gait noted with first few steps upon standing.  No pain on palpation of achilles tendon bilateral.  Ankle df less than 10 degrees with knee extended b/l.  Radiographic Exam (left foot, 3 wb views, 05/17/22):  Normal osseous mineralization. Joint spaces preserved.  Inferior calcaneal spur seen.  No fracture noted.   Assessment/Plan of Care: 1. Bilateral plantar fasciitis   2. Inferior calcaneal spur of left foot     AMB REFERRAL TO PHYSICAL THERAPY  -Reviewed etiology of plantar fasciitis with patient.   Discussed treatment options with patient today, including cortisone injection, NSAID course of treatment, stretching exercises, physical therapy, use of night splint, rest, icing the heel, arch supports/orthotics, and supportive shoe gear.    Patient refused cortisone injection today.   Rx physical therapy for bilateral plantar fasciitis - eval & treat. Patient plans to go to [facility] in [place].   F/u 4 weeks.  Set up orthotic consultation with our pedorthist if she isn't getting significant improvement from the physical therapy.     Awanda CHARM Imperial, DPM, FACFAS [facility]     [address] [place], [place] [address]                Office [phone]  Fax [phone]

## 2023-05-20 ENCOUNTER — Encounter: Payer: Self-pay | Admitting: Podiatry

## 2023-05-20 DIAGNOSIS — M722 Plantar fascial fibromatosis: Secondary | ICD-10-CM | POA: Insufficient documentation

## 2023-05-20 DIAGNOSIS — M7732 Calcaneal spur, left foot: Secondary | ICD-10-CM | POA: Insufficient documentation

## 2023-09-02 ENCOUNTER — Other Ambulatory Visit: Payer: Self-pay

## 2023-09-02 ENCOUNTER — Inpatient Hospital Stay: Payer: BC Managed Care – PPO | Admitting: Hematology & Oncology

## 2023-09-02 ENCOUNTER — Inpatient Hospital Stay: Payer: BC Managed Care – PPO | Attending: Hematology & Oncology

## 2023-09-02 VITALS — BP 118/84 | HR 69 | Temp 98.0°F | Resp 18 | Ht 69.0 in | Wt 179.0 lb

## 2023-09-02 DIAGNOSIS — D72819 Decreased white blood cell count, unspecified: Secondary | ICD-10-CM | POA: Insufficient documentation

## 2023-09-02 DIAGNOSIS — D729 Disorder of white blood cells, unspecified: Secondary | ICD-10-CM

## 2023-09-02 DIAGNOSIS — R635 Abnormal weight gain: Secondary | ICD-10-CM | POA: Insufficient documentation

## 2023-09-02 DIAGNOSIS — R195 Other fecal abnormalities: Secondary | ICD-10-CM | POA: Insufficient documentation

## 2023-09-02 DIAGNOSIS — L659 Nonscarring hair loss, unspecified: Secondary | ICD-10-CM | POA: Diagnosis not present

## 2023-09-02 DIAGNOSIS — T733XXA Exhaustion due to excessive exertion, initial encounter: Secondary | ICD-10-CM

## 2023-09-02 LAB — CBC WITH DIFFERENTIAL (CANCER CENTER ONLY)
Abs Immature Granulocytes: 0 10*3/uL (ref 0.00–0.07)
Basophils Absolute: 0 10*3/uL (ref 0.0–0.1)
Basophils Relative: 1 %
Eosinophils Absolute: 0.1 10*3/uL (ref 0.0–0.5)
Eosinophils Relative: 1 %
HCT: 41.3 % (ref 36.0–46.0)
Hemoglobin: 13.6 g/dL (ref 12.0–15.0)
Immature Granulocytes: 0 %
Lymphocytes Relative: 80 %
Lymphs Abs: 3.5 10*3/uL (ref 0.7–4.0)
MCH: 27.3 pg (ref 26.0–34.0)
MCHC: 32.9 g/dL (ref 30.0–36.0)
MCV: 82.9 fL (ref 80.0–100.0)
Monocytes Absolute: 0.3 10*3/uL (ref 0.1–1.0)
Monocytes Relative: 7 %
Neutro Abs: 0.5 10*3/uL — ABNORMAL LOW (ref 1.7–7.7)
Neutrophils Relative %: 11 %
Platelet Count: 131 10*3/uL — ABNORMAL LOW (ref 150–400)
RBC: 4.98 MIL/uL (ref 3.87–5.11)
RDW: 13.1 % (ref 11.5–15.5)
WBC Count: 4.3 10*3/uL (ref 4.0–10.5)
nRBC: 0 % (ref 0.0–0.2)

## 2023-09-02 LAB — CMP (CANCER CENTER ONLY)
ALT: 17 U/L (ref 0–44)
AST: 24 U/L (ref 15–41)
Albumin: 4.1 g/dL (ref 3.5–5.0)
Alkaline Phosphatase: 53 U/L (ref 38–126)
Anion gap: 4 — ABNORMAL LOW (ref 5–15)
BUN: 14 mg/dL (ref 6–20)
CO2: 30 mmol/L (ref 22–32)
Calcium: 9.8 mg/dL (ref 8.9–10.3)
Chloride: 104 mmol/L (ref 98–111)
Creatinine: 0.73 mg/dL (ref 0.44–1.00)
GFR, Estimated: >60 mL/min (ref >60)
Glucose, Bld: 96 mg/dL (ref 70–99)
Potassium: 4.4 mmol/L (ref 3.5–5.1)
Sodium: 138 mmol/L (ref 135–145)
Total Bilirubin: 0.8 mg/dL (ref 0.0–1.2)
Total Protein: 7.6 g/dL (ref 6.5–8.1)

## 2023-09-02 LAB — SAVE SMEAR(SSMR), FOR PROVIDER SLIDE REVIEW

## 2023-09-02 LAB — LACTATE DEHYDROGENASE: LDH: 174 U/L (ref 98–192)

## 2023-09-02 NOTE — Progress Notes (Signed)
 Hematology and Oncology Follow Up Visit  Monique Richardson [id number] Jun 01, 1965 58 y.o. 09/02/2023   Principle Diagnosis:  Chronic leukopenia --  NGS panel is (-)   Current Therapy:   Observation     Interim History:  Monique Richardson is back for follow-up.  We see her every 6 months.  The problem right now is that she is losing some of her hair.  She also is gaining some weight.  We will go ahead and check another TSH on her.  The last time that we checked was back in June 2024.  At that, her TSH was 1.8.  Otherwise, she seems to be managing.  She has had no problem with infections.  She has not had no problems with rashes.  She has had no change in bowel or bladder habits.  She did have a positive Cologuard test.  She did had a colonoscopy which was negative.  She had her cataracts removed this month.  She did well with this..  She has had no fever.  She has had no mouth sores.  She has had no leg swelling.  She has had some problems with plantar fasciitis.  Currently, I would have to say that her performance status is probably ECOG 1.   2  Medications:  Current Outpatient Medications:    Cholecalciferol (VITAMIN D3) 250 MCG (10000 UT) capsule, Take 10,000 Units by mouth daily., Disp: , Rfl:    Cod Liver Oil OIL, Take 5 mLs by mouth daily., Disp: , Rfl:    EPINEPHrine 0.3 mg/0.3 mL IJ SOAJ injection, Inject into the muscle., Disp: , Rfl:   Allergies:  Allergies  Allergen Reactions   Bee Venom Anaphylaxis   Sulfamethoxazole Anaphylaxis    Pt reports she doesn't recall reactions to medication. Pt unsure    Past Medical History, Surgical history, Social history, and Family History were reviewed and updated.  Review of Systems: Review of Systems  Constitutional: Negative.   HENT: Negative.    Eyes: Negative.   Respiratory: Negative.    Cardiovascular: Negative.   Gastrointestinal: Negative.   Genitourinary: Negative.   Musculoskeletal: Negative.   Skin:  Positive for rash.   Neurological: Negative.   Endo/Heme/Allergies: Negative.   Psychiatric/Behavioral: Negative.       Physical Exam:  height is 5\' 9"  (1.753 m) and weight is 179 lb (81.2 kg). Her oral temperature is 98 F (36.7 C). Her blood pressure is 118/84 and her pulse is 69. Her respiration is 18 and oxygen saturation is 99%.   Wt Readings from Last 3 Encounters:  09/02/23 179 lb (81.2 kg)  03/04/23 177 lb 1.9 oz (80.3 kg)  02/22/23 178 lb (80.7 kg)     Physical Exam Vitals reviewed.  HENT:     Head: Normocephalic and atraumatic.  Eyes:     Pupils: Pupils are equal, round, and reactive to light.  Cardiovascular:     Rate and Rhythm: Normal rate and regular rhythm.     Heart sounds: Normal heart sounds.  Pulmonary:     Effort: Pulmonary effort is normal.     Breath sounds: Normal breath sounds.  Abdominal:     General: Bowel sounds are normal.     Palpations: Abdomen is soft.  Musculoskeletal:        General: No tenderness or deformity. Normal range of motion.     Cervical back: Normal range of motion.  Lymphadenopathy:     Cervical: No cervical adenopathy.  Skin:    General:  Skin is warm and dry.     Findings: No erythema or rash.  Neurological:     Mental Status: She is alert and oriented to person, place, and time.  Psychiatric:        Behavior: Behavior normal.        Thought Content: Thought content normal.        Judgment: Judgment normal.       Chemistry      Component Value Date/Time   NA 138 09/02/2023 0958   NA 139 01/15/2017 1419   K 4.4 09/02/2023 0958   K 3.9 01/15/2017 1419   CL 104 09/02/2023 0958   CL 104 01/15/2017 1419   CO2 30 09/02/2023 0958   CO2 26 01/15/2017 1419   BUN 14 09/02/2023 0958   BUN 16 01/15/2017 1419   CREATININE 0.73 09/02/2023 0958   CREATININE 0.81 01/15/2017 1419      Component Value Date/Time   CALCIUM 9.8 09/02/2023 0958   CALCIUM 9.8 01/15/2017 1419   ALKPHOS 53 09/02/2023 0958   ALKPHOS 56 01/15/2017 1419   AST 24  09/02/2023 0958   ALT 17 09/02/2023 0958   BILITOT 0.8 09/02/2023 0958      Impression and Plan: Monique Richardson is a 58 year old white female. She has chronic leukopenia. [name] forprobably over 10 years.  So far, she really has never had any problem with infections.  Again I am not too sure why she has the weight gain and the hair loss.  We will check another TSH on her.  From my point of view, I still think we get her back to see us  in another 6 months.  I forgot to mention that she did have a wonderful time over in [place] since we last saw her.  She was over there with her husband.  They had a great time.   Ivor Mars, MD 4/24/202511:22 AM

## 2024-03-02 ENCOUNTER — Other Ambulatory Visit

## 2024-03-02 ENCOUNTER — Ambulatory Visit: Admitting: Hematology & Oncology

## 2024-03-09 ENCOUNTER — Inpatient Hospital Stay: Admitting: Hematology & Oncology

## 2024-03-09 ENCOUNTER — Inpatient Hospital Stay: Attending: Hematology & Oncology

## 2024-03-09 ENCOUNTER — Telehealth: Payer: Self-pay | Admitting: *Deleted

## 2024-03-09 VITALS — BP 120/84 | HR 74 | Temp 98.2°F | Resp 17 | Wt 181.0 lb

## 2024-03-09 DIAGNOSIS — R635 Abnormal weight gain: Secondary | ICD-10-CM | POA: Insufficient documentation

## 2024-03-09 DIAGNOSIS — D72819 Decreased white blood cell count, unspecified: Secondary | ICD-10-CM | POA: Diagnosis present

## 2024-03-09 DIAGNOSIS — L659 Nonscarring hair loss, unspecified: Secondary | ICD-10-CM | POA: Diagnosis not present

## 2024-03-09 DIAGNOSIS — D729 Disorder of white blood cells, unspecified: Secondary | ICD-10-CM

## 2024-03-09 DIAGNOSIS — T733XXA Exhaustion due to excessive exertion, initial encounter: Secondary | ICD-10-CM

## 2024-03-09 LAB — CBC WITH DIFFERENTIAL (CANCER CENTER ONLY)
Abs Immature Granulocytes: 0 K/uL (ref 0.00–0.07)
Basophils Absolute: 0 K/uL (ref 0.0–0.1)
Basophils Relative: 1 %
Eosinophils Absolute: 0.1 K/uL (ref 0.0–0.5)
Eosinophils Relative: 3 %
HCT: 37.9 % (ref 36.0–46.0)
Hemoglobin: 12.6 g/dL (ref 12.0–15.0)
Immature Granulocytes: 0 %
Lymphocytes Relative: 79 %
Lymphs Abs: 3.1 K/uL (ref 0.7–4.0)
MCH: 27.4 pg (ref 26.0–34.0)
MCHC: 33.2 g/dL (ref 30.0–36.0)
MCV: 82.4 fL (ref 80.0–100.0)
Monocytes Absolute: 0.3 K/uL (ref 0.1–1.0)
Monocytes Relative: 7 %
Neutro Abs: 0.4 K/uL — CL (ref 1.7–7.7)
Neutrophils Relative %: 10 %
Platelet Count: 124 K/uL — ABNORMAL LOW (ref 150–400)
RBC: 4.6 MIL/uL (ref 3.87–5.11)
RDW: 13.1 % (ref 11.5–15.5)
Smear Review: NORMAL
WBC Count: 3.9 K/uL — ABNORMAL LOW (ref 4.0–10.5)
nRBC: 0 % (ref 0.0–0.2)

## 2024-03-09 LAB — CMP (CANCER CENTER ONLY)
ALT: 26 U/L (ref 0–44)
AST: 34 U/L (ref 15–41)
Albumin: 4 g/dL (ref 3.5–5.0)
Alkaline Phosphatase: 64 U/L (ref 38–126)
Anion gap: 8 (ref 5–15)
BUN: 17 mg/dL (ref 6–20)
CO2: 28 mmol/L (ref 22–32)
Calcium: 9.4 mg/dL (ref 8.9–10.3)
Chloride: 106 mmol/L (ref 98–111)
Creatinine: 0.7 mg/dL (ref 0.44–1.00)
GFR, Estimated: >60 mL/min (ref >60)
Glucose, Bld: 94 mg/dL (ref 70–99)
Potassium: 4.2 mmol/L (ref 3.5–5.1)
Sodium: 141 mmol/L (ref 135–145)
Total Bilirubin: 0.7 mg/dL (ref 0.0–1.2)
Total Protein: 7.5 g/dL (ref 6.5–8.1)

## 2024-03-09 LAB — LACTATE DEHYDROGENASE: LDH: 204 U/L — ABNORMAL HIGH (ref 98–192)

## 2024-03-09 LAB — SAVE SMEAR(SSMR), FOR PROVIDER SLIDE REVIEW

## 2024-03-09 LAB — TSH: TSH: 1.74 u[IU]/mL (ref 0.350–4.500)

## 2024-03-09 NOTE — Telephone Encounter (Signed)
 Received call from [facility] in [facility] ER lab with a critical ANC 0.4. Results given to MD.

## 2024-03-09 NOTE — Progress Notes (Signed)
 Hematology and Oncology Follow Up Visit  PALOMA GRANGE [id number] [birth year] 58 y.o. 03/09/2024   Principle Diagnosis:  Chronic leukopenia --  NGS panel is (-)   Current Therapy:   Observation     Interim History:  Ms. Wilcoxson is back for follow-up.  We see her every 6 months.  The problem right now is that she is losing some of her hair.  She also is gaining some weight.  We will go ahead and check another TSH on her.  The last time that we checked was back in June 2024.  At that, her TSH was 1.8.  Otherwise, she seems to be managing.  She has had no problem with infections.  She has not had no problems with rashes.  She has had no change in bowel or bladder habits.  She did have a positive Cologuard test.  She did had a colonoscopy which was negative.  She had her cataracts removed this month.  She did well with this..  She has had no fever.  She has had no mouth sores.  She has had no leg swelling.  She has had some problems with plantar fasciitis.  Currently, I would have to say that her performance status is probably ECOG 1.   2  Medications:  Current Outpatient Medications:    Cholecalciferol (VITAMIN D3) 250 MCG (10000 UT) capsule, Take 10,000 Units by mouth daily., Disp: , Rfl:    Cod Liver Oil OIL, Take 5 mLs by mouth daily., Disp: , Rfl:    EPINEPHrine 0.3 mg/0.3 mL IJ SOAJ injection, Inject into the muscle., Disp: , Rfl:   Allergies:  Allergies  Allergen Reactions   Bee Venom Anaphylaxis   Sulfamethoxazole Anaphylaxis    Pt reports she doesn't recall reactions to medication. Pt unsure    Past Medical History, Surgical history, Social history, and Family History were reviewed and updated.  Review of Systems: Review of Systems  Constitutional: Negative.   HENT: Negative.    Eyes: Negative.   Respiratory: Negative.    Cardiovascular: Negative.   Gastrointestinal: Negative.   Genitourinary: Negative.   Musculoskeletal: Negative.   Skin:  Positive for rash.   Neurological: Negative.   Endo/Heme/Allergies: Negative.   Psychiatric/Behavioral: Negative.       Physical Exam:  weight is 181 lb (82.1 kg). Her oral temperature is 98.2 F (36.8 C). Her blood pressure is 120/84 and her pulse is 74. Her respiration is 17 and oxygen saturation is 100%.   Wt Readings from Last 3 Encounters:  03/09/24 181 lb (82.1 kg)  09/02/23 179 lb (81.2 kg)  03/04/23 177 lb 1.9 oz (80.3 kg)     Physical Exam Vitals reviewed.  HENT:     Head: Normocephalic and atraumatic.  Eyes:     Pupils: Pupils are equal, round, and reactive to light.  Cardiovascular:     Rate and Rhythm: Normal rate and regular rhythm.     Heart sounds: Normal heart sounds.  Pulmonary:     Effort: Pulmonary effort is normal.     Breath sounds: Normal breath sounds.  Abdominal:     General: Bowel sounds are normal.     Palpations: Abdomen is soft.  Musculoskeletal:        General: No tenderness or deformity. Normal range of motion.     Cervical back: Normal range of motion.  Lymphadenopathy:     Cervical: No cervical adenopathy.  Skin:    General: Skin is warm and dry.  Findings: No erythema or rash.  Neurological:     Mental Status: She is alert and oriented to person, place, and time.  Psychiatric:        Behavior: Behavior normal.        Thought Content: Thought content normal.        Judgment: Judgment normal.       Chemistry      Component Value Date/Time   NA 141 03/09/2024 0947   NA 139 01/15/2017 1419   K 4.2 03/09/2024 0947   K 3.9 01/15/2017 1419   CL 106 03/09/2024 0947   CL 104 01/15/2017 1419   CO2 28 03/09/2024 0947   CO2 26 01/15/2017 1419   BUN 17 03/09/2024 0947   BUN 16 01/15/2017 1419   CREATININE 0.70 03/09/2024 0947   CREATININE 0.81 01/15/2017 1419      Component Value Date/Time   CALCIUM 9.4 03/09/2024 0947   CALCIUM 9.8 01/15/2017 1419   ALKPHOS 64 03/09/2024 0947   ALKPHOS 56 01/15/2017 1419   AST 34 03/09/2024 0947   ALT 26  03/09/2024 0947   BILITOT 0.7 03/09/2024 0947      Impression and Plan: Ms. Farler is a 58 year old white female. She has chronic leukopenia. [name] forprobably over 10 years.  So far, she really has never had any problem with infections.  Again I am not too sure why she has the weight gain and the hair loss.  We will check another TSH on her.  From my point of view, I still think we get her back to see us  in another 6 months.  I forgot to mention that she did have a wonderful time over in [place] since we last saw her.  She was over there with her husband.  They had a great time.   Maude JONELLE Crease, MD 10/30/202510:46 AM

## 2024-09-07 ENCOUNTER — Inpatient Hospital Stay: Admitting: Hematology & Oncology

## 2024-09-07 ENCOUNTER — Inpatient Hospital Stay
